# Patient Record
Sex: Male | Born: 1955 | Race: White | Hispanic: No | Smoking: Never smoker
Health system: Southern US, Community
[De-identification: ages and names within clinical notes are randomized; demographics above are authoritative.]

## PROBLEM LIST (undated history)

## (undated) DIAGNOSIS — J40 Bronchitis, not specified as acute or chronic: Secondary | ICD-10-CM

## (undated) DIAGNOSIS — I10 Essential (primary) hypertension: Secondary | ICD-10-CM

---
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
## 2003-12-19 ENCOUNTER — Ambulatory Visit (HOSPITAL_COMMUNITY): Admission: RE | Admit: 2003-12-19 | Discharge: 2003-12-19 | Payer: Self-pay | Admitting: Gastroenterology

## 2010-01-16 HISTORY — PX: COLONOSCOPY: SHX174

## 2011-03-20 ENCOUNTER — Encounter: Payer: Self-pay | Admitting: Emergency Medicine

## 2011-03-20 ENCOUNTER — Emergency Department (HOSPITAL_COMMUNITY)
Admission: EM | Admit: 2011-03-20 | Discharge: 2011-03-20 | Disposition: A | Discharge status: Home / Self Care | Payer: No Typology Code available for payment source | Attending: Emergency Medicine | Admitting: Emergency Medicine

## 2011-03-20 ENCOUNTER — Emergency Department (HOSPITAL_COMMUNITY): Payer: No Typology Code available for payment source

## 2011-03-20 DIAGNOSIS — S39012A Strain of muscle, fascia and tendon of lower back, initial encounter: Secondary | ICD-10-CM

## 2011-03-20 DIAGNOSIS — J3489 Other specified disorders of nose and nasal sinuses: Secondary | ICD-10-CM | POA: Insufficient documentation

## 2011-03-20 DIAGNOSIS — M542 Cervicalgia: Secondary | ICD-10-CM | POA: Insufficient documentation

## 2011-03-20 DIAGNOSIS — S139XXA Sprain of joints and ligaments of unspecified parts of neck, initial encounter: Secondary | ICD-10-CM | POA: Insufficient documentation

## 2011-03-20 DIAGNOSIS — S335XXA Sprain of ligaments of lumbar spine, initial encounter: Secondary | ICD-10-CM | POA: Insufficient documentation

## 2011-03-20 DIAGNOSIS — M545 Low back pain, unspecified: Secondary | ICD-10-CM | POA: Insufficient documentation

## 2011-03-20 DIAGNOSIS — I10 Essential (primary) hypertension: Secondary | ICD-10-CM | POA: Insufficient documentation

## 2011-03-20 DIAGNOSIS — S161XXA Strain of muscle, fascia and tendon at neck level, initial encounter: Secondary | ICD-10-CM

## 2011-03-20 HISTORY — DX: Essential (primary) hypertension: I10

## 2011-03-20 HISTORY — DX: Bronchitis, not specified as acute or chronic: J40

## 2011-03-20 MED ORDER — NAPROXEN 500 MG PO TABS: 500.0000 mg | ORAL_TABLET | Freq: Two times a day (BID) | ORAL | Status: AC

## 2011-03-20 MED ORDER — HYDROCODONE-ACETAMINOPHEN 5-325 MG PO TABS: 1.0000 | ORAL_TABLET | Freq: Four times a day (QID) | ORAL | Status: AC | PRN

## 2011-03-20 MED ORDER — CYCLOBENZAPRINE HCL 10 MG PO TABS: 10.0000 mg | ORAL_TABLET | Freq: Two times a day (BID) | ORAL | Status: AC | PRN

## 2011-03-20 NOTE — ED Notes (Signed)
Patient removed from LSB by EDP. 
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
## 2013-11-25 DIAGNOSIS — I10 Essential (primary) hypertension: Secondary | ICD-10-CM | POA: Insufficient documentation

## 2015-03-17 DIAGNOSIS — F411 Generalized anxiety disorder: Secondary | ICD-10-CM | POA: Insufficient documentation

## 2015-07-28 ENCOUNTER — Other Ambulatory Visit: Payer: Self-pay | Admitting: Gastroenterology

## 2015-11-30 DIAGNOSIS — E559 Vitamin D deficiency, unspecified: Secondary | ICD-10-CM | POA: Insufficient documentation

## 2015-11-30 DIAGNOSIS — E785 Hyperlipidemia, unspecified: Secondary | ICD-10-CM | POA: Insufficient documentation

## 2018-01-05 DIAGNOSIS — F321 Major depressive disorder, single episode, moderate: Secondary | ICD-10-CM | POA: Insufficient documentation

## 2018-02-19 DIAGNOSIS — F329 Major depressive disorder, single episode, unspecified: Secondary | ICD-10-CM | POA: Insufficient documentation

## 2018-11-13 ENCOUNTER — Ambulatory Visit: Payer: Self-pay | Admitting: Family Medicine

## 2018-12-22 ENCOUNTER — Other Ambulatory Visit: Payer: Self-pay

## 2018-12-23 ENCOUNTER — Ambulatory Visit (INDEPENDENT_AMBULATORY_CARE_PROVIDER_SITE_OTHER): Payer: Medicare Other | Admitting: Family Medicine

## 2018-12-23 ENCOUNTER — Encounter: Payer: Self-pay | Admitting: Family Medicine

## 2018-12-23 VITALS — BP 146/91 | HR 85 | Temp 98.7°F | Ht 70.5 in | Wt 186.2 lb

## 2018-12-23 DIAGNOSIS — M545 Low back pain, unspecified: Secondary | ICD-10-CM | POA: Insufficient documentation

## 2018-12-23 DIAGNOSIS — I10 Essential (primary) hypertension: Secondary | ICD-10-CM | POA: Diagnosis not present

## 2018-12-23 DIAGNOSIS — Z23 Encounter for immunization: Secondary | ICD-10-CM

## 2018-12-23 DIAGNOSIS — E782 Mixed hyperlipidemia: Secondary | ICD-10-CM | POA: Diagnosis not present

## 2018-12-23 DIAGNOSIS — H6122 Impacted cerumen, left ear: Secondary | ICD-10-CM | POA: Diagnosis not present

## 2018-12-23 DIAGNOSIS — G8929 Other chronic pain: Secondary | ICD-10-CM

## 2018-12-23 DIAGNOSIS — F3342 Major depressive disorder, recurrent, in full remission: Secondary | ICD-10-CM

## 2018-12-23 MED ORDER — DULOXETINE HCL 60 MG PO CPEP: 60.0000 mg | ORAL_CAPSULE | Freq: Two times a day (BID) | ORAL | 3 refills | Status: DC

## 2018-12-23 MED ORDER — TELMISARTAN 40 MG PO TABS: 40.0000 mg | ORAL_TABLET | Freq: Every day | ORAL | 3 refills | Status: DC

## 2018-12-23 NOTE — Progress Notes (Signed)
BP (!) 146/91   Pulse 85   Temp 98.7 F (37.1 C) (Temporal)   Ht 5' 10.5" (1.791 m)   Wt 186 lb 3.2 oz (84.5 kg)   SpO2 97%   BMI 26.34 kg/m    Subjective:    Patient ID: Isaiah Cook, male    DOB: 08-15-1955, 63 y.o.   MRN: [id number]  HPI: Isaiah Cook is a 63 y.o. male presenting on 12/23/2018 for New Patient (Initial Visit) ([place]) and Establish Care   HPI Patient is coming in to establish care for anxiety and depression and low back pain.  He currently takes Cymbalta to help with anxiety and depression the low back pain and feels like it is been working very well for him.  He denies any major mood swings or mood issues and says that it helps him with his back a lot.  He is retired and part of the issue was being at work all the time for his back but he has been doing a lot better since he has been retired.  Hypertension Patient is currently on losartan but he felt like he did better on the telmisartan, and their blood pressure today is 146/91. Patient denies any lightheadedness or dizziness. Patient denies headaches, blurred vision, chest pains, shortness of breath, or weakness. Denies any side effects from medication and is content with current medication.   Hyperlipidemia Patient is coming in for recheck of his hyperlipidemia. The patient is currently taking no medication and has been monitoring. They deny any issues with myalgias or history of liver damage from it. They deny any focal numbness or weakness or chest pain.   Relevant past medical, surgical, family and social history reviewed and updated as indicated. Interim medical history since our last visit reviewed. Allergies and medications reviewed and updated.  Review of Systems  Constitutional: Negative for chills and fever.  Eyes: Negative for visual disturbance.  Respiratory: Negative for shortness of breath and wheezing.   Cardiovascular: Negative for chest pain and leg swelling.  Musculoskeletal:  Negative for back pain and gait problem.  Skin: Negative for rash.  Neurological: Negative for dizziness, weakness and numbness.  All other systems reviewed and are negative.   Per HPI unless specifically indicated above  Social History   Socioeconomic History  . Marital status: Single    Spouse name: Not on file  . Number of children: Not on file  . Years of education: Not on file  . Highest education level: Not on file  Occupational History  . Not on file  Social Needs  . Financial resource strain: Not on file  . Food insecurity    Worry: Not on file    Inability: Not on file  . Transportation needs    Medical: Not on file    Non-medical: Not on file  Tobacco Use  . Smoking status: Never Smoker  . Smokeless tobacco: Never Used  Substance and Sexual Activity  . Alcohol use: No  . Drug use: No  . Sexual activity: Not Currently  Lifestyle  . Physical activity    Days per week: Not on file    Minutes per session: Not on file  . Stress: Not on file  Relationships  . Social Herbalist on phone: Not on file    Gets together: Not on file    Attends religious service: Not on file    Active member of club or organization: Not on file  Attends meetings of clubs or organizations: Not on file    Relationship status: Not on file  . Intimate partner violence    Fear of current or ex partner: Not on file    Emotionally abused: Not on file    Physically abused: Not on file    Forced sexual activity: Not on file  Other Topics Concern  . Not on file  Social History Narrative  . Not on file    Past Surgical History:  Procedure Laterality Date  . COLONOSCOPY  01/16/2010  . cranial steal plate  [id number]    Family History  Problem Relation Age of Onset  . Cancer Mother        colon  . Alzheimer's disease Mother   . Heart attack Father     Allergies as of 12/23/2018      Reactions   Penicillins Anaphylaxis, Rash      Medication List       Accurate as of  December 23, 2018  3:57 PM. If you have any questions, ask your nurse or doctor.        STOP taking these medications   telmisartan-hydrochlorothiazide 40-12.5 MG tablet Commonly known as: MICARDIS HCT Stopped by: Fransisca Kaufmann Dettinger, MD     TAKE these medications   DULoxetine 60 MG capsule Commonly known as: CYMBALTA Take 1 capsule by mouth 2 (two) times daily.   fluticasone 50 MCG/ACT nasal spray Commonly known as: FLONASE USE 2 SPRAYS IN EACH NOSTRIL DAILY   ibuprofen 200 MG tablet Commonly known as: ADVIL Take 200-400 mg by mouth every 6 (six) hours as needed. Pain   losartan 50 MG tablet Commonly known as: COZAAR Take 1 tablet by mouth daily.          Objective:    BP (!) 146/91   Pulse 85   Temp 98.7 F (37.1 C) (Temporal)   Ht 5' 10.5" (1.791 m)   Wt 186 lb 3.2 oz (84.5 kg)   SpO2 97%   BMI 26.34 kg/m   Wt Readings from Last 3 Encounters:  12/23/18 186 lb 3.2 oz (84.5 kg)  03/20/11 208 lb (94.3 kg)    Physical Exam Vitals signs and nursing note reviewed.  Constitutional:      General: He is not in acute distress.    Appearance: He is well-developed. He is not diaphoretic.  Eyes:     General: No scleral icterus.    Conjunctiva/sclera: Conjunctivae normal.  Neck:     Musculoskeletal: Neck supple.     Thyroid: No thyromegaly.  Cardiovascular:     Rate and Rhythm: Normal rate and regular rhythm.     Heart sounds: Normal heart sounds. No murmur.  Pulmonary:     Effort: Pulmonary effort is normal. No respiratory distress.     Breath sounds: Normal breath sounds. No wheezing.  Musculoskeletal: Normal range of motion.  Lymphadenopathy:     Cervical: No cervical adenopathy.  Skin:    General: Skin is warm and dry.     Findings: No rash.  Neurological:     Mental Status: He is alert and oriented to person, place, and time.     Coordination: Coordination normal.  Psychiatric:        Behavior: Behavior normal.    Nurse to lavage ears for cerumen  impaction, able to get cerumen lavaged out of left ear significantly, patient tolerated well without any issue. No results found for this or any previous visit.    Assessment &  Plan:   Problem List Items Addressed This Visit      Cardiovascular and Mediastinum   Essential hypertension   Relevant Medications   telmisartan (MICARDIS) 40 MG tablet   Other Relevant Orders   BMP8+EGFR     Other   Major depressive disorder - Primary   Relevant Medications   DULoxetine (CYMBALTA) 60 MG capsule   Low back pain   Relevant Medications   DULoxetine (CYMBALTA) 60 MG capsule    Other Visit Diagnoses    Need for immunization against influenza       Relevant Orders   Flu Vaccine QUAD High Dose(Fluad) (Completed)   Mixed hyperlipidemia       Relevant Medications   telmisartan (MICARDIS) 40 MG tablet   Other Relevant Orders   Lipid panel   Hearing loss of left ear due to cerumen impaction          Will continue the Cymbalta and will switch from losartan to telmisartan  We will see back in 6 months, monitor blood pressure at home Follow up plan: Return in about 6 months (around 06/23/2019), or if symptoms worsen or fail to improve, for htn and hld.  Caryl Pina, MD [facility] 12/23/2018, 3:57 PM

## 2018-12-24 LAB — BMP8+EGFR
BUN/Creatinine Ratio: 18 (ref 10–24)
BUN: 15 mg/dL (ref 8–27)
CO2: 22 mmol/L (ref 20–29)
Calcium: 9.5 mg/dL (ref 8.6–10.2)
Chloride: 99 mmol/L (ref 96–106)
Creatinine, Ser: 0.82 mg/dL (ref 0.76–1.27)
GFR calc Af Amer: 107 mL/min/{1.73_m2} (ref >59)
GFR calc non Af Amer: 93 mL/min/{1.73_m2} (ref >59)
Glucose: 68 mg/dL (ref 65–99)
Potassium: 3.9 mmol/L (ref 3.5–5.2)
Sodium: 137 mmol/L (ref 134–144)

## 2018-12-24 LAB — LIPID PANEL
Chol/HDL Ratio: 4.9 ratio (ref 0.0–5.0)
Cholesterol, Total: 263 mg/dL — ABNORMAL HIGH (ref 100–199)
HDL: 54 mg/dL (ref >39)
LDL Chol Calc (NIH): 175 mg/dL — ABNORMAL HIGH (ref 0–99)
Triglycerides: 185 mg/dL — ABNORMAL HIGH (ref 0–149)
VLDL Cholesterol Cal: 34 mg/dL (ref 5–40)

## 2018-12-25 ENCOUNTER — Telehealth: Payer: Self-pay | Admitting: Family Medicine

## 2018-12-25 ENCOUNTER — Other Ambulatory Visit: Payer: Self-pay

## 2018-12-25 DIAGNOSIS — E782 Mixed hyperlipidemia: Secondary | ICD-10-CM

## 2018-12-25 MED ORDER — PRAVASTATIN SODIUM 20 MG PO TABS: 20.0000 mg | ORAL_TABLET | Freq: Every day | ORAL | 3 refills | Status: DC

## 2018-12-25 NOTE — Telephone Encounter (Signed)
Called and spoke to patient in regards to cholesterol medication.  Please see result note

## 2018-12-25 NOTE — Progress Notes (Signed)
Sent in Pravastatin 20 mg 1 tab nightly # 90 with 3 refills per Dr. Merita Norton result note. Please refer to result note in labs. MPulliam, CMA/RT(R)

## 2019-06-01 ENCOUNTER — Other Ambulatory Visit: Payer: Self-pay

## 2019-06-01 DIAGNOSIS — E782 Mixed hyperlipidemia: Secondary | ICD-10-CM

## 2019-06-01 DIAGNOSIS — I10 Essential (primary) hypertension: Secondary | ICD-10-CM

## 2019-06-23 ENCOUNTER — Encounter: Payer: Self-pay | Admitting: Family Medicine

## 2019-06-23 ENCOUNTER — Telehealth (INDEPENDENT_AMBULATORY_CARE_PROVIDER_SITE_OTHER): Payer: Medicare Other | Admitting: Family Medicine

## 2019-06-23 DIAGNOSIS — E785 Hyperlipidemia, unspecified: Secondary | ICD-10-CM | POA: Diagnosis not present

## 2019-06-23 DIAGNOSIS — I10 Essential (primary) hypertension: Secondary | ICD-10-CM | POA: Diagnosis not present

## 2019-06-23 DIAGNOSIS — F411 Generalized anxiety disorder: Secondary | ICD-10-CM

## 2019-06-23 DIAGNOSIS — F3342 Major depressive disorder, recurrent, in full remission: Secondary | ICD-10-CM | POA: Diagnosis not present

## 2019-06-23 NOTE — Progress Notes (Signed)
Virtual Visit via Mychart video Note  I connected with Isaiah Cook on 06/23/19 at 0900 by video and verified that I am speaking with the correct person using two identifiers. Isaiah Cook is currently located at home and no other people are currently with her during visit. The provider, Fransisca Kaufmann Brita Jurgensen, MD is located in their office at time of visit.  Call ended at 0915  I discussed the limitations, risks, security and privacy concerns of performing an evaluation and management service by video and the availability of in person appointments. I also discussed with the patient that there may be a patient responsible charge related to this service. The patient expressed understanding and agreed to proceed.  Had both shots of Moderna  History and Present Illness: Hypertension  Patient is currently on telmisartan, and their blood pressure today is 136/79- [phone]. Patient denies any lightheadedness or dizziness. Patient denies headaches, blurred vision, chest pains, shortness of breath, or weakness. Denies any side effects from medication and is content with current medication.   Hyperlipidemia Patient is coming in for recheck of his hyperlipidemia. The patient is currently taking pravastatin. They deny any issues with myalgias or history of liver damage from it. They deny any focal numbness or weakness or chest pain.   Anxiety Patient is calling in for recheck and is doing well with the cymbalta.  No diagnosis found.  Outpatient Encounter Medications as of 06/23/2019  Medication Sig  . DULoxetine (CYMBALTA) 60 MG capsule Take 1 capsule (60 mg total) by mouth 2 (two) times daily.  . fluticasone (FLONASE) 50 MCG/ACT nasal spray USE 2 SPRAYS IN EACH NOSTRIL DAILY  . ibuprofen (ADVIL,MOTRIN) 200 MG tablet Take 200-400 mg by mouth every 6 (six) hours as needed. Pain   . pravastatin (PRAVACHOL) 20 MG tablet Take 1 tablet (20 mg total) by mouth daily.  Marland Kitchen telmisartan (MICARDIS) 40  MG tablet Take 1 tablet (40 mg total) by mouth daily.   No facility-administered encounter medications on file as of 06/23/2019.    Review of Systems  Constitutional: Negative for chills and fever.  Eyes: Negative for visual disturbance.  Respiratory: Negative for shortness of breath and wheezing.   Cardiovascular: Negative for chest pain and leg swelling.  Musculoskeletal: Negative for back pain and gait problem.  Skin: Negative for rash.  Neurological: Negative for dizziness, weakness and light-headedness.  All other systems reviewed and are negative.   Observations/Objective: Patient sounds and looks comfortable and in no acute distress  Assessment and Plan: Problem List Items Addressed This Visit      Cardiovascular and Mediastinum   Essential hypertension   Relevant Orders   CMP14+EGFR     Other   Anxiety, generalized   Relevant Orders   CBC with Differential/Platelet   Dyslipidemia - Primary   Relevant Orders   Lipid panel   Major depressive disorder   Relevant Orders   CBC with Differential/Platelet      Continue current medications and no changes Follow up plan: Return in about 6 months (around 12/23/2019), or if symptoms worsen or fail to improve, for htn and dylipidemia and anxiety.     I discussed the assessment and treatment plan with the patient. The patient was provided an opportunity to ask questions and all were answered. The patient agreed with the plan and demonstrated an understanding of the instructions.   The patient was advised to call back or seek an in-person evaluation if the symptoms worsen or if the condition  fails to improve as anticipated.  The above assessment and management plan was discussed with the patient. The patient verbalized understanding of and has agreed to the management plan. Patient is aware to call the clinic if symptoms persist or worsen. Patient is aware when to return to the clinic for a follow-up visit. Patient educated on  when it is appropriate to go to the emergency department.    I provided 15 minutes of non-face-to-face time during this encounter.    Worthy Rancher, MD

## 2019-07-13 ENCOUNTER — Other Ambulatory Visit: Payer: Self-pay

## 2019-07-13 ENCOUNTER — Other Ambulatory Visit: Payer: Medicare Other

## 2019-07-13 DIAGNOSIS — I10 Essential (primary) hypertension: Secondary | ICD-10-CM | POA: Diagnosis not present

## 2019-07-13 DIAGNOSIS — E785 Hyperlipidemia, unspecified: Secondary | ICD-10-CM

## 2019-07-13 DIAGNOSIS — F3342 Major depressive disorder, recurrent, in full remission: Secondary | ICD-10-CM

## 2019-07-13 DIAGNOSIS — F411 Generalized anxiety disorder: Secondary | ICD-10-CM

## 2019-07-13 LAB — CBC WITH DIFFERENTIAL/PLATELET
Basophils Absolute: 0.1 10*3/uL (ref 0.0–0.2)
Basos: 1 %
EOS (ABSOLUTE): 0.2 10*3/uL (ref 0.0–0.4)
Eos: 4 %
Hematocrit: 45.2 % (ref 37.5–51.0)
Hemoglobin: 15.4 g/dL (ref 13.0–17.7)
Immature Grans (Abs): 0 10*3/uL (ref 0.0–0.1)
Immature Granulocytes: 0 %
Lymphocytes Absolute: 2.1 10*3/uL (ref 0.7–3.1)
Lymphs: 38 %
MCH: 30.1 pg (ref 26.6–33.0)
MCHC: 34.1 g/dL (ref 31.5–35.7)
MCV: 89 fL (ref 79–97)
Monocytes Absolute: 0.5 10*3/uL (ref 0.1–0.9)
Monocytes: 9 %
Neutrophils Absolute: 2.6 10*3/uL (ref 1.4–7.0)
Neutrophils: 48 %
Platelets: 157 10*3/uL (ref 150–450)
RBC: 5.11 x10E6/uL (ref 4.14–5.80)
RDW: 12.9 % (ref 11.6–15.4)
WBC: 5.4 10*3/uL (ref 3.4–10.8)

## 2019-07-13 LAB — LIPID PANEL
Chol/HDL Ratio: 3.9 ratio (ref 0.0–5.0)
Cholesterol, Total: 189 mg/dL (ref 100–199)
HDL: 48 mg/dL (ref >39)
LDL Chol Calc (NIH): 123 mg/dL — ABNORMAL HIGH (ref 0–99)
Triglycerides: 101 mg/dL (ref 0–149)
VLDL Cholesterol Cal: 18 mg/dL (ref 5–40)

## 2019-07-13 LAB — CMP14+EGFR
ALT: 45 IU/L — ABNORMAL HIGH (ref 0–44)
AST: 34 IU/L (ref 0–40)
Albumin/Globulin Ratio: 2 (ref 1.2–2.2)
Albumin: 4.3 g/dL (ref 3.8–4.8)
Alkaline Phosphatase: 74 IU/L (ref 39–117)
BUN/Creatinine Ratio: 23 (ref 10–24)
BUN: 20 mg/dL (ref 8–27)
Bilirubin Total: 0.4 mg/dL (ref 0.0–1.2)
CO2: 25 mmol/L (ref 20–29)
Calcium: 9.3 mg/dL (ref 8.6–10.2)
Chloride: 104 mmol/L (ref 96–106)
Creatinine, Ser: 0.88 mg/dL (ref 0.76–1.27)
GFR calc Af Amer: 103 mL/min/{1.73_m2} (ref >59)
GFR calc non Af Amer: 90 mL/min/{1.73_m2} (ref >59)
Globulin, Total: 2.2 g/dL (ref 1.5–4.5)
Glucose: 104 mg/dL — ABNORMAL HIGH (ref 65–99)
Potassium: 4.1 mmol/L (ref 3.5–5.2)
Sodium: 141 mmol/L (ref 134–144)
Total Protein: 6.5 g/dL (ref 6.0–8.5)

## 2019-09-22 ENCOUNTER — Other Ambulatory Visit: Payer: Self-pay | Admitting: Family Medicine

## 2019-09-22 DIAGNOSIS — E782 Mixed hyperlipidemia: Secondary | ICD-10-CM

## 2019-10-27 ENCOUNTER — Other Ambulatory Visit: Payer: Self-pay | Admitting: Family Medicine

## 2019-11-19 ENCOUNTER — Other Ambulatory Visit: Payer: Self-pay | Admitting: Family Medicine

## 2019-11-19 DIAGNOSIS — I10 Essential (primary) hypertension: Secondary | ICD-10-CM

## 2019-11-30 ENCOUNTER — Other Ambulatory Visit: Payer: Self-pay | Admitting: Family Medicine

## 2019-11-30 DIAGNOSIS — F3342 Major depressive disorder, recurrent, in full remission: Secondary | ICD-10-CM

## 2019-11-30 DIAGNOSIS — G8929 Other chronic pain: Secondary | ICD-10-CM

## 2019-12-27 ENCOUNTER — Other Ambulatory Visit: Payer: Self-pay | Admitting: Family Medicine

## 2019-12-27 DIAGNOSIS — E782 Mixed hyperlipidemia: Secondary | ICD-10-CM

## 2020-01-12 ENCOUNTER — Encounter: Payer: Self-pay | Admitting: Family Medicine

## 2020-01-12 ENCOUNTER — Ambulatory Visit (INDEPENDENT_AMBULATORY_CARE_PROVIDER_SITE_OTHER): Payer: Medicare Other | Admitting: Family Medicine

## 2020-01-12 ENCOUNTER — Other Ambulatory Visit: Payer: Self-pay

## 2020-01-12 VITALS — BP 129/76 | HR 75 | Temp 98.0°F | Ht 70.5 in | Wt 198.0 lb

## 2020-01-12 DIAGNOSIS — E785 Hyperlipidemia, unspecified: Secondary | ICD-10-CM | POA: Diagnosis not present

## 2020-01-12 DIAGNOSIS — E782 Mixed hyperlipidemia: Secondary | ICD-10-CM

## 2020-01-12 DIAGNOSIS — Z23 Encounter for immunization: Secondary | ICD-10-CM | POA: Diagnosis not present

## 2020-01-12 DIAGNOSIS — I10 Essential (primary) hypertension: Secondary | ICD-10-CM | POA: Diagnosis not present

## 2020-01-12 DIAGNOSIS — F411 Generalized anxiety disorder: Secondary | ICD-10-CM

## 2020-01-12 DIAGNOSIS — F3342 Major depressive disorder, recurrent, in full remission: Secondary | ICD-10-CM

## 2020-01-12 LAB — CBC WITH DIFFERENTIAL/PLATELET
EOS (ABSOLUTE): 0.1 10*3/uL (ref 0.0–0.4)
Hematocrit: 45.4 % (ref 37.5–51.0)
Immature Granulocytes: 0 %
Lymphs: 37 %
RBC: 5.33 x10E6/uL (ref 4.14–5.80)
RDW: 12.8 % (ref 11.6–15.4)

## 2020-01-12 LAB — LIPID PANEL

## 2020-01-12 LAB — CMP14+EGFR
Bilirubin Total: 0.6 mg/dL (ref 0.0–1.2)
GFR calc Af Amer: 101 mL/min/{1.73_m2} (ref >59)
Globulin, Total: 2.2 g/dL (ref 1.5–4.5)

## 2020-01-12 MED ORDER — DULOXETINE HCL 60 MG PO CPEP: 60.0000 mg | ORAL_CAPSULE | Freq: Two times a day (BID) | ORAL | 3 refills | Status: DC

## 2020-01-12 MED ORDER — PRAVASTATIN SODIUM 20 MG PO TABS: 20.0000 mg | ORAL_TABLET | Freq: Every day | ORAL | 3 refills | Status: DC

## 2020-01-12 NOTE — Progress Notes (Signed)
BP 129/76    Pulse 75    Temp 98 F (36.7 C)    Ht 5' 10.5" (1.791 m)    Wt 198 lb (89.8 kg)    SpO2 98%    BMI 28.01 kg/m    Subjective:   Patient ID: Isaiah Cook, male    DOB: 1956-01-17, 64 y.o.   MRN: [id number]  HPI: Isaiah Cook is a 64 y.o. male presenting on 01/12/2020 for Medical Management of Chronic Issues, Hyperlipidemia, and Depression   HPI Hypertension Patient is currently on telmisartan, and their blood pressure today is 129/76. Patient denies any lightheadedness or dizziness. Patient denies headaches, blurred vision, chest pains, shortness of breath, or weakness. Denies any side effects from medication and is content with current medication.   Hyperlipidemia Patient is coming in for recheck of his hyperlipidemia. The patient is currently taking pravastatin. They deny any issues with myalgias or history of liver damage from it. They deny any focal numbness or weakness or chest pain.   Depression anxiety recheck Patient is taking Cymbalta for depression anxiety to help with his back.  He seems to be doing very well on it and says that it is working.  He denies any major issues with it.  Relevant past medical, surgical, family and social history reviewed and updated as indicated. Interim medical history since our last visit reviewed. Allergies and medications reviewed and updated.  Review of Systems  Constitutional: Negative for chills and fever.  Respiratory: Negative for shortness of breath and wheezing.   Cardiovascular: Negative for chest pain and leg swelling.  Musculoskeletal: Negative for back pain and gait problem.  Skin: Negative for rash.  Neurological: Negative for dizziness, weakness and light-headedness.  All other systems reviewed and are negative.   Per HPI unless specifically indicated above   Allergies as of 01/12/2020      Reactions   Penicillins Anaphylaxis, Rash      Medication List       Accurate as of January 12, 2020 11:11  AM. If you have any questions, ask your nurse or doctor.        DULoxetine 60 MG capsule Commonly known as: CYMBALTA Take 1 capsule (60 mg total) by mouth 2 (two) times daily.   fluticasone 50 MCG/ACT nasal spray Commonly known as: FLONASE USE 2 SPRAYS IN EACH NOSTRIL DAILY   ibuprofen 200 MG tablet Commonly known as: ADVIL Take 200-400 mg by mouth every 6 (six) hours as needed. Pain   pravastatin 20 MG tablet Commonly known as: PRAVACHOL Take 1 tablet (20 mg total) by mouth daily. What changed: See the new instructions. Changed by: Fransisca Kaufmann Solina Heron, MD   telmisartan 40 MG tablet Commonly known as: MICARDIS TAKE ONE (1) TABLET EACH DAY   vitamin C with rose hips 500 MG tablet Take 500 mg by mouth daily.   Zinc 50 MG Caps Take by mouth.        Objective:   BP 129/76    Pulse 75    Temp 98 F (36.7 C)    Ht 5' 10.5" (1.791 m)    Wt 198 lb (89.8 kg)    SpO2 98%    BMI 28.01 kg/m   Wt Readings from Last 3 Encounters:  01/12/20 198 lb (89.8 kg)  12/23/18 186 lb 3.2 oz (84.5 kg)  03/20/11 208 lb (94.3 kg)    Physical Exam Vitals and nursing note reviewed.  Constitutional:      General: He is  not in acute distress.    Appearance: He is well-developed. He is not diaphoretic.  Eyes:     General: No scleral icterus.    Conjunctiva/sclera: Conjunctivae normal.  Neck:     Thyroid: No thyromegaly.  Cardiovascular:     Rate and Rhythm: Normal rate and regular rhythm.     Heart sounds: Normal heart sounds. No murmur heard.   Pulmonary:     Effort: Pulmonary effort is normal. No respiratory distress.     Breath sounds: Normal breath sounds. No wheezing.  Musculoskeletal:        General: Normal range of motion.     Cervical back: Neck supple.  Lymphadenopathy:     Cervical: No cervical adenopathy.  Skin:    General: Skin is warm and dry.     Findings: No rash.  Neurological:     Mental Status: He is alert and oriented to person, place, and time.      Coordination: Coordination normal.  Psychiatric:        Behavior: Behavior normal.       Assessment & Plan:   Problem List Items Addressed This Visit      Cardiovascular and Mediastinum   Essential hypertension - Primary   Relevant Medications   pravastatin (PRAVACHOL) 20 MG tablet   Other Relevant Orders   CBC with Differential/Platelet   CMP14+EGFR   Lipid panel     Other   Anxiety, generalized   Relevant Medications   DULoxetine (CYMBALTA) 60 MG capsule   Other Relevant Orders   CBC with Differential/Platelet   Dyslipidemia   Relevant Medications   pravastatin (PRAVACHOL) 20 MG tablet   Other Relevant Orders   Lipid panel   Major depressive disorder   Relevant Medications   DULoxetine (CYMBALTA) 60 MG capsule    Other Visit Diagnoses    Need for vaccination against Streptococcus pneumoniae       Relevant Orders   Pneumococcal polysaccharide vaccine 23-valent greater than or equal to 2yo subcutaneous/IM   Need for immunization against influenza       Relevant Orders   Flu Vaccine QUAD High Dose(Fluad) (Completed)   Mixed hyperlipidemia       Relevant Medications   pravastatin (PRAVACHOL) 20 MG tablet      Seems to be doing well, continue current medication, no changes. Follow up plan: Return in about 6 months (around 07/12/2020), or if symptoms worsen or fail to improve, for Physical exam and recheck cholesterol.  Counseling provided for all of the vaccine components Orders Placed This Encounter  Procedures   Pneumococcal polysaccharide vaccine 23-valent greater than or equal to 2yo subcutaneous/IM   Flu Vaccine QUAD High Dose(Fluad)   CBC with Differential/Platelet   CMP14+EGFR   Lipid panel    Caryl Pina, MD [facility] 01/12/2020, 11:11 AM

## 2020-01-13 LAB — LIPID PANEL
Chol/HDL Ratio: 4.8 ratio (ref 0.0–5.0)
Cholesterol, Total: 203 mg/dL — ABNORMAL HIGH (ref 100–199)
HDL: 42 mg/dL (ref >39)
LDL Chol Calc (NIH): 137 mg/dL — ABNORMAL HIGH (ref 0–99)
Triglycerides: 131 mg/dL (ref 0–149)

## 2020-01-13 LAB — CMP14+EGFR
ALT: 40 IU/L (ref 0–44)
AST: 33 IU/L (ref 0–40)
Albumin/Globulin Ratio: 2.1 (ref 1.2–2.2)
Albumin: 4.6 g/dL (ref 3.8–4.8)
Alkaline Phosphatase: 69 IU/L (ref 44–121)
BUN/Creatinine Ratio: 15 (ref 10–24)
BUN: 14 mg/dL (ref 8–27)
CO2: 24 mmol/L (ref 20–29)
Calcium: 9.6 mg/dL (ref 8.6–10.2)
Chloride: 102 mmol/L (ref 96–106)
Creatinine, Ser: 0.91 mg/dL (ref 0.76–1.27)
GFR calc non Af Amer: 88 mL/min/{1.73_m2} (ref >59)
Glucose: 102 mg/dL — ABNORMAL HIGH (ref 65–99)
Potassium: 4.1 mmol/L (ref 3.5–5.2)
Sodium: 140 mmol/L (ref 134–144)
Total Protein: 6.8 g/dL (ref 6.0–8.5)

## 2020-01-13 LAB — CBC WITH DIFFERENTIAL/PLATELET
Basophils Absolute: 0.1 10*3/uL (ref 0.0–0.2)
Basos: 1 %
Eos: 3 %
Hemoglobin: 15.9 g/dL (ref 13.0–17.7)
Immature Grans (Abs): 0 10*3/uL (ref 0.0–0.1)
Lymphocytes Absolute: 2 10*3/uL (ref 0.7–3.1)
MCH: 29.8 pg (ref 26.6–33.0)
MCHC: 35 g/dL (ref 31.5–35.7)
MCV: 85 fL (ref 79–97)
Monocytes Absolute: 0.6 10*3/uL (ref 0.1–0.9)
Monocytes: 10 %
Neutrophils Absolute: 2.6 10*3/uL (ref 1.4–7.0)
Neutrophils: 49 %
Platelets: 171 10*3/uL (ref 150–450)
WBC: 5.3 10*3/uL (ref 3.4–10.8)

## 2020-01-18 ENCOUNTER — Telehealth: Payer: Self-pay

## 2020-01-18 NOTE — Telephone Encounter (Signed)
Left message to return call 

## 2020-01-25 ENCOUNTER — Telehealth: Payer: Self-pay

## 2020-01-25 MED ORDER — PRAVASTATIN SODIUM 40 MG PO TABS: 40.0000 mg | ORAL_TABLET | Freq: Every day | ORAL | 1 refills | Status: DC

## 2020-01-25 NOTE — Telephone Encounter (Signed)
Patient aware of lab results.

## 2020-02-24 ENCOUNTER — Telehealth: Payer: Self-pay

## 2020-02-24 NOTE — Telephone Encounter (Signed)
Patient aware to wait 30 days.

## 2020-04-05 ENCOUNTER — Ambulatory Visit (INDEPENDENT_AMBULATORY_CARE_PROVIDER_SITE_OTHER): Payer: Medicare Other | Admitting: Family Medicine

## 2020-04-05 ENCOUNTER — Other Ambulatory Visit: Payer: Self-pay

## 2020-04-05 ENCOUNTER — Encounter: Payer: Self-pay | Admitting: Family Medicine

## 2020-04-05 VITALS — BP 133/74 | HR 94 | Ht 70.0 in | Wt 202.0 lb

## 2020-04-05 DIAGNOSIS — H9202 Otalgia, left ear: Secondary | ICD-10-CM

## 2020-04-05 DIAGNOSIS — H6122 Impacted cerumen, left ear: Secondary | ICD-10-CM | POA: Diagnosis not present

## 2020-04-05 MED ORDER — NEOMYCIN-POLYMYXIN-HC 3.5-10000-1 OT SOLN: 3.0000 [drp] | Freq: Four times a day (QID) | OTIC | 0 refills | Status: DC

## 2020-04-05 NOTE — Progress Notes (Signed)
BP 133/74   Pulse 94   Ht 5\' 10"  (1.778 m)   Wt 202 lb (91.6 kg)   SpO2 99%   BMI 28.98 kg/m    Subjective:   Patient ID: Isaiah Cook, male    DOB: 09/08/1955, 65 y.o.   MRN: [id number]  HPI: Isaiah Cook is a 65 y.o. male presenting on 04/05/2020 for Ear Fullness (Itching and left sided head pain)   HPI Patient is coming in today with complaints of left ear pain that is hurting back towards the back of his head on that left side.  He says he is got like this before and has used some eardrops for it.  He says that it has started hurting this time over the past month and a half.  Is not severe but more annoying.  He denies any fevers or chills.  He also has itching in the left ear canal as well as the right sometimes but the left one is the one that is hurting him.  He denies any fevers or chills.  He denies any cough or congestion or sinus issues.  Relevant past medical, surgical, family and social history reviewed and updated as indicated. Interim medical history since our last visit reviewed. Allergies and medications reviewed and updated.  Review of Systems  Constitutional: Negative for chills and fever.  HENT: Positive for ear pain. Negative for congestion and ear discharge.   Eyes: Negative for visual disturbance.  Respiratory: Negative for shortness of breath and wheezing.   Cardiovascular: Negative for chest pain and leg swelling.  Musculoskeletal: Negative for back pain and gait problem.  Skin: Negative for rash.  All other systems reviewed and are negative.   Per HPI unless specifically indicated above   Allergies as of 04/05/2020      Reactions   Penicillins Anaphylaxis, Rash      Medication List       Accurate as of April 05, 2020  3:37 PM. If you have any questions, ask your nurse or doctor.        DULoxetine 60 MG capsule Commonly known as: CYMBALTA Take 1 capsule (60 mg total) by mouth 2 (two) times daily.   fluticasone 50 MCG/ACT nasal  spray Commonly known as: FLONASE USE 2 SPRAYS IN EACH NOSTRIL DAILY   ibuprofen 200 MG tablet Commonly known as: ADVIL Take 200-400 mg by mouth every 6 (six) hours as needed. Pain   pravastatin 40 MG tablet Commonly known as: PRAVACHOL Take 1 tablet (40 mg total) by mouth daily.   telmisartan 40 MG tablet Commonly known as: MICARDIS TAKE ONE (1) TABLET EACH DAY   vitamin C with rose hips 500 MG tablet Take 500 mg by mouth daily.   Zinc 50 MG Caps Take by mouth.        Objective:   BP 133/74   Pulse 94   Ht 5\' 10"  (1.778 m)   Wt 202 lb (91.6 kg)   SpO2 99%   BMI 28.98 kg/m   Wt Readings from Last 3 Encounters:  04/05/20 202 lb (91.6 kg)  01/12/20 198 lb (89.8 kg)  12/23/18 186 lb 3.2 oz (84.5 kg)    Physical Exam Vitals and nursing note reviewed.  HENT:     Left Ear: There is impacted cerumen.  Neurological:     Mental Status: He is alert.       Assessment & Plan:   Problem List Items Addressed This Visit   None  Visit Diagnoses    Left ear pain    -  Primary   Relevant Medications   neomycin-polymyxin-hydrocortisone (CORTISPORIN) OTIC solution   Impacted cerumen of left ear       Relevant Medications   neomycin-polymyxin-hydrocortisone (CORTISPORIN) OTIC solution     Nurse to lavage, patient tolerated well.   Follow up plan: Return if symptoms worsen or fail to improve.  Counseling provided for all of the vaccine components No orders of the defined types were placed in this encounter.   Arville Care, MD [facility] 04/05/2020, 3:37 PM

## 2020-05-17 ENCOUNTER — Other Ambulatory Visit: Payer: Self-pay | Admitting: Family Medicine

## 2020-07-12 ENCOUNTER — Other Ambulatory Visit: Payer: Self-pay

## 2020-07-12 ENCOUNTER — Encounter: Payer: Self-pay | Admitting: Family Medicine

## 2020-07-12 ENCOUNTER — Ambulatory Visit (INDEPENDENT_AMBULATORY_CARE_PROVIDER_SITE_OTHER): Payer: Medicare Other | Admitting: Family Medicine

## 2020-07-12 VITALS — BP 141/83 | HR 79 | Ht 70.0 in | Wt 209.0 lb

## 2020-07-12 DIAGNOSIS — I10 Essential (primary) hypertension: Secondary | ICD-10-CM

## 2020-07-12 DIAGNOSIS — Z0001 Encounter for general adult medical examination with abnormal findings: Secondary | ICD-10-CM

## 2020-07-12 DIAGNOSIS — E785 Hyperlipidemia, unspecified: Secondary | ICD-10-CM | POA: Diagnosis not present

## 2020-07-12 DIAGNOSIS — F3342 Major depressive disorder, recurrent, in full remission: Secondary | ICD-10-CM | POA: Diagnosis not present

## 2020-07-12 DIAGNOSIS — Z125 Encounter for screening for malignant neoplasm of prostate: Secondary | ICD-10-CM

## 2020-07-12 MED ORDER — PRAVASTATIN SODIUM 40 MG PO TABS: ORAL_TABLET | ORAL | 3 refills | Status: DC

## 2020-07-12 MED ORDER — FLUTICASONE PROPIONATE 50 MCG/ACT NA SUSP: 2.0000 | Freq: Every day | NASAL | 3 refills | Status: DC

## 2020-07-12 MED ORDER — TELMISARTAN 40 MG PO TABS: ORAL_TABLET | ORAL | 3 refills | Status: DC

## 2020-07-12 MED ORDER — DULOXETINE HCL 60 MG PO CPEP: 60.0000 mg | ORAL_CAPSULE | Freq: Two times a day (BID) | ORAL | 3 refills | Status: DC

## 2020-07-12 NOTE — Progress Notes (Signed)
BP (!) 141/83   Pulse 79   Ht 5\' 10"  (1.778 m)   Wt 209 lb (94.8 kg)   SpO2 97%   BMI 29.99 kg/m    Subjective:   Patient ID: Isaiah Cook, male    DOB: [birth year], 65 y.o.   MRN: [id number]  HPI: Isaiah Cook is a 65 y.o. male presenting on 07/12/2020 for Medical Management of Chronic Issues, Hyperlipidemia, Hypertension, and Depression   HPI  Physical exam Patient is coming in today for physical exam cholesterol.  He is going to have his colonoscopy later this year so we will defer checking a prostate exam and then.  Hyperlipidemia Patient is coming in for recheck of his hyperlipidemia. The patient is currently taking pravastatin. They deny any issues with myalgias or history of liver damage from it. They deny any focal numbness or weakness or chest pain.   Hypertension Patient is currently on Micardis, and their blood pressure today is 141/83. Patient denies any lightheadedness or dizziness. Patient denies headaches, blurred vision, chest pains, shortness of breath, or weakness. Denies any side effects from medication and is content with current medication.   Anxiety depression and back pain Patient takes Xarelto for anxiety depression back pain and says is working well for him.  He denies major issues.  He feels like everything is going well and healthwise doing pretty good.  He denies any suicidal ideations or thoughts of hurting himself.  Relevant past medical, surgical, family and social history reviewed and updated as indicated. Interim medical history since our last visit reviewed. Allergies and medications reviewed and updated.  Review of Systems  Constitutional: Negative for chills and fever.  Eyes: Negative for visual disturbance.  Respiratory: Negative for shortness of breath and wheezing.   Cardiovascular: Negative for chest pain and leg swelling.  Musculoskeletal: Negative for back pain and gait problem.  Skin: Negative for rash.  Neurological: Negative  for dizziness, weakness and light-headedness.  All other systems reviewed and are negative.   Per HPI unless specifically indicated above   Allergies as of 07/12/2020      Reactions   Penicillins Anaphylaxis, Rash      Medication List       Accurate as of July 12, 2020 10:40 AM. If you have any questions, ask your nurse or doctor.        DULoxetine 60 MG capsule Commonly known as: CYMBALTA Take 1 capsule (60 mg total) by mouth 2 (two) times daily.   fluticasone 50 MCG/ACT nasal spray Commonly known as: FLONASE USE 2 SPRAYS IN EACH NOSTRIL DAILY   ibuprofen 200 MG tablet Commonly known as: ADVIL Take 200-400 mg by mouth every 6 (six) hours as needed. Pain   neomycin-polymyxin-hydrocortisone OTIC solution Commonly known as: CORTISPORIN Place 3 drops into the left ear 4 (four) times daily.   pravastatin 40 MG tablet Commonly known as: PRAVACHOL TAKE ONE (1) TABLET EACH DAY   telmisartan 40 MG tablet Commonly known as: MICARDIS TAKE ONE (1) TABLET EACH DAY   vitamin C with rose hips 500 MG tablet Take 500 mg by mouth daily.   Zinc 50 MG Caps Take by mouth.        Objective:   BP (!) 141/83   Pulse 79   Ht 5\' 10"  (1.778 m)   Wt 209 lb (94.8 kg)   SpO2 97%   BMI 29.99 kg/m   Wt Readings from Last 3 Encounters:  07/12/20 209 lb (94.8 kg)  04/05/20  202 lb (91.6 kg)  01/12/20 198 lb (89.8 kg)    Physical Exam Vitals and nursing note reviewed.  Constitutional:      General: He is not in acute distress.    Appearance: He is well-developed. He is not diaphoretic.  Eyes:     General: No scleral icterus.    Conjunctiva/sclera: Conjunctivae normal.  Neck:     Thyroid: No thyromegaly.  Cardiovascular:     Rate and Rhythm: Normal rate and regular rhythm.     Heart sounds: Normal heart sounds. No murmur heard.   Pulmonary:     Effort: Pulmonary effort is normal. No respiratory distress.     Breath sounds: Normal breath sounds. No wheezing.   Musculoskeletal:        General: Normal range of motion.     Cervical back: Neck supple.  Lymphadenopathy:     Cervical: No cervical adenopathy.  Skin:    General: Skin is warm and dry.     Findings: No rash.  Neurological:     Mental Status: He is alert and oriented to person, place, and time.     Coordination: Coordination normal.  Psychiatric:        Behavior: Behavior normal.       Assessment & Plan:   Problem List Items Addressed This Visit      Cardiovascular and Mediastinum   Essential hypertension   Relevant Medications   pravastatin (PRAVACHOL) 40 MG tablet   telmisartan (MICARDIS) 40 MG tablet   Other Relevant Orders   CBC with Differential/Platelet   CMP14+EGFR     Other   Dyslipidemia - Primary   Relevant Medications   pravastatin (PRAVACHOL) 40 MG tablet   Other Relevant Orders   Lipid panel   Major depressive disorder   Relevant Medications   DULoxetine (CYMBALTA) 60 MG capsule    Other Visit Diagnoses    Prostate cancer screening       Relevant Orders   PSA, total and free     Continue current medication, seems to be doing well.  We will check prostate antigen.  Blood work and he is going to call for colonoscopy.  Follow up plan: Return in about 6 months (around 01/11/2021), or if symptoms worsen or fail to improve, for Hypertension and hyperlipidemia and physical.  Counseling provided for all of the vaccine components No orders of the defined types were placed in this encounter.   Caryl Pina, MD [facility] 07/12/2020, 10:40 AM

## 2020-07-13 LAB — CMP14+EGFR
ALT: 26 IU/L (ref 0–44)
AST: 24 IU/L (ref 0–40)
Albumin/Globulin Ratio: 2.2 (ref 1.2–2.2)
Albumin: 4.7 g/dL (ref 3.8–4.8)
Alkaline Phosphatase: 72 IU/L (ref 44–121)
BUN/Creatinine Ratio: 21 (ref 10–24)
BUN: 18 mg/dL (ref 8–27)
Bilirubin Total: 0.4 mg/dL (ref 0.0–1.2)
CO2: 25 mmol/L (ref 20–29)
Calcium: 9.7 mg/dL (ref 8.6–10.2)
Chloride: 97 mmol/L (ref 96–106)
Creatinine, Ser: 0.87 mg/dL (ref 0.76–1.27)
Globulin, Total: 2.1 g/dL (ref 1.5–4.5)
Glucose: 97 mg/dL (ref 65–99)
Potassium: 4.6 mmol/L (ref 3.5–5.2)
Sodium: 137 mmol/L (ref 134–144)
Total Protein: 6.8 g/dL (ref 6.0–8.5)
eGFR: 95 mL/min/{1.73_m2} (ref >59)

## 2020-07-13 LAB — LIPID PANEL
Chol/HDL Ratio: 4.2 ratio (ref 0.0–5.0)
Cholesterol, Total: 166 mg/dL (ref 100–199)
HDL: 40 mg/dL (ref >39)
LDL Chol Calc (NIH): 101 mg/dL — ABNORMAL HIGH (ref 0–99)
Triglycerides: 143 mg/dL (ref 0–149)
VLDL Cholesterol Cal: 25 mg/dL (ref 5–40)

## 2020-07-13 LAB — CBC WITH DIFFERENTIAL/PLATELET
Basophils Absolute: 0.1 10*3/uL (ref 0.0–0.2)
Basos: 1 %
EOS (ABSOLUTE): 0.2 10*3/uL (ref 0.0–0.4)
Eos: 4 %
Hematocrit: 46.8 % (ref 37.5–51.0)
Hemoglobin: 15.8 g/dL (ref 13.0–17.7)
Immature Grans (Abs): 0 10*3/uL (ref 0.0–0.1)
Immature Granulocytes: 0 %
Lymphocytes Absolute: 2.1 10*3/uL (ref 0.7–3.1)
Lymphs: 35 %
MCH: 30 pg (ref 26.6–33.0)
MCHC: 33.8 g/dL (ref 31.5–35.7)
MCV: 89 fL (ref 79–97)
Monocytes Absolute: 0.6 10*3/uL (ref 0.1–0.9)
Monocytes: 11 %
Neutrophils Absolute: 2.9 10*3/uL (ref 1.4–7.0)
Neutrophils: 49 %
Platelets: 189 10*3/uL (ref 150–450)
RBC: 5.26 x10E6/uL (ref 4.14–5.80)
RDW: 13.1 % (ref 11.6–15.4)
WBC: 5.8 10*3/uL (ref 3.4–10.8)

## 2020-07-13 LAB — PSA, TOTAL AND FREE
PSA, Free Pct: 18.6 %
PSA, Free: 0.13 ng/mL
Prostate Specific Ag, Serum: 0.7 ng/mL (ref 0.0–4.0)

## 2020-07-14 ENCOUNTER — Telehealth: Payer: Self-pay | Admitting: Family Medicine

## 2020-07-14 NOTE — Progress Notes (Signed)
Pt calling back about labs  

## 2020-09-21 ENCOUNTER — Ambulatory Visit (INDEPENDENT_AMBULATORY_CARE_PROVIDER_SITE_OTHER): Payer: Medicare Other

## 2020-09-21 VITALS — Ht 70.0 in | Wt 200.0 lb

## 2020-09-21 DIAGNOSIS — Z0000 Encounter for general adult medical examination without abnormal findings: Secondary | ICD-10-CM | POA: Diagnosis not present

## 2020-09-21 NOTE — Progress Notes (Signed)
Subjective:   Isaiah Cook is a 65 y.o. male who presents for an Initial Medicare Annual Wellness Visit.  Virtual Visit via Telephone Note  I connected with  RENJI BERWICK on 09/21/20 at  9:45 AM EDT by telephone and verified that I am speaking with the correct person using two identifiers.  Location: Patient: Home Provider: [facility] Persons participating in the virtual visit: patient/Nurse Health Advisor   I discussed the limitations, risks, security and privacy concerns of performing an evaluation and management service by telephone and the availability of in person appointments. The patient expressed understanding and agreed to proceed.  Interactive audio and video telecommunications were attempted between this nurse and patient, however failed, due to patient having technical difficulties OR patient did not have access to video capability.  We continued and completed visit with audio only.  Some vital signs may be absent or patient reported.   Anaalicia Reimann E Saraphina Lauderbaugh, LPN   Review of Systems     Cardiac Risk Factors include: advanced age (>35men, >68 women);dyslipidemia;hypertension;male gender     Objective:    Today's Vitals   09/21/20 0906  Weight: 200 lb (90.7 kg)  Height: 5\' 10"  (1.778 m)  PainSc: 2    Body mass index is 28.7 kg/m.  Advanced Directives 09/21/2020  Does Patient Have a Medical Advance Directive? No  Would patient like information on creating a medical advance directive? No - Patient declined    Current Medications (verified) Outpatient Encounter Medications as of 09/21/2020  Medication Sig   Ascorbic Acid (VITAMIN C WITH ROSE HIPS) 500 MG tablet Take 500 mg by mouth daily.   DULoxetine (CYMBALTA) 60 MG capsule Take 1 capsule (60 mg total) by mouth 2 (two) times daily.   fluticasone (FLONASE) 50 MCG/ACT nasal spray Place 2 sprays into both nostrils daily.   ibuprofen (ADVIL,MOTRIN) 200 MG tablet Take 200-400 mg by mouth every 6 (six) hours as needed.  Pain   neomycin-polymyxin-hydrocortisone (CORTISPORIN) OTIC solution Place 3 drops into the left ear 4 (four) times daily.   pravastatin (PRAVACHOL) 40 MG tablet TAKE ONE (1) TABLET EACH DAY   telmisartan (MICARDIS) 40 MG tablet TAKE ONE (1) TABLET EACH DAY   Zinc 50 MG CAPS Take by mouth.   No facility-administered encounter medications on file as of 09/21/2020.    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   Family History  Problem Relation Age of Onset   Cancer Mother        colon   Alzheimer's disease Mother    Heart attack Father    Social History   Socioeconomic History   Marital status: Divorced    Spouse name: Not on file   Number of children: 1   Years of education: Not on file   Highest education level: Not on file  Occupational History   Not on file  Tobacco Use   Smoking status: Never   Smokeless tobacco: Never  Vaping Use   Vaping Use: Never used  Substance and Sexual Activity   Alcohol use: No   Drug use: No   Sexual activity: Not Currently    Comment: divorced  Other Topics Concern   Not on file  Social History Narrative   Lives alone - talks to daughter and sister every day  Social Determinants of Health   Financial Resource Strain: Low Risk    Difficulty of Paying Living Expenses: Not hard at all  Food Insecurity: No Food Insecurity   Worried About Programme researcher, broadcasting/film/video in the Last Year: Never true   Ran Out of Food in the Last Year: Never true  Transportation Needs: No Transportation Needs   Lack of Transportation (Medical): No   Lack of Transportation (Non-Medical): No  Physical Activity: Sufficiently Active   Days of Exercise per Week: 3 days   Minutes of Exercise per Session: 60 min  Stress: No Stress Concern Present   Feeling of Stress : Not at all  Social  Connections: Moderately Integrated   Frequency of Communication with Friends and Family: More than three times a week   Frequency of Social Gatherings with Friends and Family: More than three times a week   Attends Religious Services: More than 4 times per year   Active Member of [employer] or Organizations: Yes   Attends Engineer, structural: More than 4 times per year   Marital Status: Divorced    Tobacco Counseling Counseling given: Not Answered   Clinical Intake:  Pre-visit preparation completed: Yes  Pain : 0-10 Pain Score: 2  Pain Type: Chronic pain Pain Location: Generalized Pain Descriptors / Indicators: Aching Pain Onset: More than a month ago Pain Frequency: Intermittent     BMI - recorded: 28.7 Nutritional Status: BMI 25 -29 Overweight Nutritional Risks: None Diabetes: No  How often do you need to have someone help you when you read instructions, pamphlets, or other written materials from your doctor or pharmacy?: 1 - Never  Diabetic? No  Interpreter Needed?: No  Information entered by :: Guelda Batson, LPN   Activities of Daily Living In your present state of health, do you have any difficulty performing the following activities: 09/21/2020  Hearing? N  Vision? N  Difficulty concentrating or making decisions? N  Walking or climbing stairs? N  Dressing or bathing? N  Doing errands, shopping? N  Preparing Food and eating ? N  Using the Toilet? N  In the past six months, have you accidently leaked urine? N  Do you have problems with loss of bowel control? N  Managing your Medications? N  Managing your Finances? N  Housekeeping or managing your Housekeeping? N  Some recent data might be hidden    Patient Care Team: Dettinger, Elige Radon, MD as PCP - General (Family Medicine)  Indicate any recent Medical Services you may have received from other than [facility] providers in the past year (date may be approximate).     Assessment:   This is a routine  wellness examination for [facility].  Hearing/Vision screen Hearing Screening - Comments:: Denies hearing difficulties  Vision Screening - Comments:: Wears eyeglasses - behind on annual eye exam with [facility] in [place] - has appt soon  Dietary issues and exercise activities discussed: Current Exercise Habits: Home exercise routine, Type of exercise: walking;strength training/weights, Time (Minutes): 60, Frequency (Times/Week): 3, Weekly Exercise (Minutes/Week): 180, Intensity: Moderate, Exercise limited by: None identified   Goals Addressed             This Visit's Progress    Exercise 3x per week (30 min per time)   On track      Depression Screen PHQ 2/9 Scores 09/21/2020 07/12/2020 04/05/2020 01/12/2020 12/23/2018  PHQ - 2 Score 0 0 0 0 0    Fall Risk Fall Risk  09/21/2020 07/12/2020 04/05/2020 01/12/2020  12/23/2018  Falls in the past year? 0 0 0 0 0  Number falls in past yr: 0 - - - -  Injury with Fall? 0 - - - -  Risk for fall due to : Impaired vision;Orthopedic patient - - - -  Follow up Falls prevention discussed - - - -    FALL RISK PREVENTION PERTAINING TO THE HOME:  Any stairs in or around the home? Yes  If so, are there any without handrails? No  Home free of loose throw rugs in walkways, pet beds, electrical cords, etc? Yes  Adequate lighting in your home to reduce risk of falls? Yes   ASSISTIVE DEVICES UTILIZED TO PREVENT FALLS:  Life alert? No  Use of a cane, walker or w/c? No  Grab bars in the bathroom? No  Shower chair or bench in shower? No  Elevated toilet seat or a handicapped toilet? No   TIMED UP AND GO:  Was the test performed? No . Telephonic visit  Cognitive Function:     6CIT Screen 09/21/2020  What Year? 0 points  What month? 0 points  What time? 0 points  Count back from 20 0 points  Months in reverse 0 points  Repeat phrase 0 points  Total Score 0    Immunizations Immunization History  Administered Date(s) Administered   Fluad Quad(high  Dose 65+) 12/23/2018, 01/12/2020   Moderna Sars-Covid-2 Vaccination 05/13/2019, 06/11/2019   Pneumococcal Conjugate-13 08/18/2018   Pneumococcal Polysaccharide-23 01/12/2020   Tdap 11/25/2008, 12/23/2018    TDAP status: Up to date  Flu Vaccine status: Up to date  Pneumococcal vaccine status: Up to date  Covid-19 vaccine status: Completed vaccines  Qualifies for Shingles Vaccine? Yes   Zostavax completed No   Shingrix Completed?: No.    Education has been provided regarding the importance of this vaccine. Patient has been advised to call insurance company to determine out of pocket expense if they have not yet received this vaccine. Advised may also receive vaccine at local pharmacy or Health Dept. Verbalized acceptance and understanding.  Screening Tests Health Maintenance  Topic Date Due   Zoster Vaccines- Shingrix (1 of 2) Never done   COVID-19 Vaccine (3 - Booster for Moderna series) 01/03/2021 (Originally 11/11/2019)   COLONOSCOPY (Pts 45-51yrs Insurance coverage will need to be confirmed)  01/11/2021 (Originally 05/13/1998)   Hepatitis C Screening  01/11/2021 (Originally 05/14/1973)   INFLUENZA VACCINE  10/16/2020   TETANUS/TDAP  12/22/2028   PNA vac Low Risk Adult  Completed   HPV VACCINES  Aged Out    Health Maintenance  Health Maintenance Due  Topic Date Due   Zoster Vaccines- Shingrix (1 of 2) Never done    Colorectal cancer screening: Type of screening: Colonoscopy. Completed 07/28/2015. Repeat every 5 years  Lung Cancer Screening: (Low Dose CT Chest recommended if Age 70-80 years, 30 pack-year currently smoking OR have quit w/in 15years.) does not qualify.   Additional Screening:  Hepatitis C Screening: does qualify; Needs this drawn  Vision Screening: Recommended annual ophthalmology exams for early detection of glaucoma and other disorders of the eye. Is the patient up to date with their annual eye exam?  No  Who is the provider or what is the name of the  office in which the patient attends annual eye exams? [facility] If pt is not established with a provider, would they like to be referred to a provider to establish care? No .   Dental Screening: Recommended annual dental exams for  proper oral hygiene  Community Resource Referral / Chronic Care Management: CRR required this visit?  No   CCM required this visit?  No      Plan:     I have personally reviewed and noted the following in the patient's chart:   Medical and social history Use of alcohol, tobacco or illicit drugs  Current medications and supplements including opioid prescriptions. Patient is not currently taking opioid prescriptions. Functional ability and status Nutritional status Physical activity Advanced directives List of other physicians Hospitalizations, surgeries, and ER visits in previous 12 months Vitals Screenings to include cognitive, depression, and falls Referrals and appointments  In addition, I have reviewed and discussed with patient certain preventive protocols, quality metrics, and best practice recommendations. A written personalized care plan for preventive services as well as general preventive health recommendations were provided to patient.     [place] Constablemy E Dequavius Kuhner, LPN   1/6/10967/09/2020   Nurse Notes: None

## 2020-09-21 NOTE — Patient Instructions (Signed)
Isaiah Cook , Thank you for taking time to come for your Medicare Wellness Visit. I appreciate your ongoing commitment to your health goals. Please review the following plan we discussed and let me know if I can assist you in the future.   Screening recommendations/referrals: Colonoscopy: Done 07/28/2015 - Repeat in 5 years - Has appointment Recommended yearly ophthalmology/optometry visit for glaucoma screening and checkup Recommended yearly dental visit for hygiene and checkup  Vaccinations: Influenza vaccine: Done 01/12/2020 - Repeat annually  Pneumococcal vaccine: Done 08/18/2018 & 01/12/2020 Tdap vaccine: Done 12/23/2018 - Repeat in 10 years  Shingles vaccine: Due. Shingrix discussed. Please contact your pharmacy for coverage information.     Covid-19: Done 05/13/19, 06/11/19 - need date of booster  Advanced directives: Advance directive discussed with you today. Even though you declined this today, please call our office should you change your mind, and we can give you the proper paperwork for you to fill out.   Conditions/risks identified: Keep up the great work! Aim for 30 minutes of exercise or brisk walking each day, drink 6-8 glasses of water and eat lots of fruits and vegetables.   Next appointment: Follow up in one year for your annual wellness visit.   Preventive Care 67 Years and Older, Male  Preventive care refers to lifestyle choices and visits with your health care provider that can promote health and wellness. What does preventive care include? A yearly physical exam. This is also called an annual well check. Dental exams once or twice a year. Routine eye exams. Ask your health care provider how often you should have your eyes checked. Personal lifestyle choices, including: Daily care of your teeth and gums. Regular physical activity. Eating a healthy diet. Avoiding tobacco and drug use. Limiting alcohol use. Practicing safe sex. Taking low doses of aspirin every  day. Taking vitamin and mineral supplements as recommended by your health care provider. What happens during an annual well check? The services and screenings done by your health care provider during your annual well check will depend on your age, overall health, lifestyle risk factors, and family history of disease. Counseling  Your health care provider may ask you questions about your: Alcohol use. Tobacco use. Drug use. Emotional well-being. Home and relationship well-being. Sexual activity. Eating habits. History of falls. Memory and ability to understand (cognition). Work and work Astronomer. Screening  You may have the following tests or measurements: Height, weight, and BMI. Blood pressure. Lipid and cholesterol levels. These may be checked every 5 years, or more frequently if you are over 36 years old. Skin check. Lung cancer screening. You may have this screening every year starting at age 78 if you have a 30-pack-year history of smoking and currently smoke or have quit within the past 15 years. Fecal occult blood test (FOBT) of the stool. You may have this test every year starting at age 34. Flexible sigmoidoscopy or colonoscopy. You may have a sigmoidoscopy every 5 years or a colonoscopy every 10 years starting at age 83. Prostate cancer screening. Recommendations will vary depending on your family history and other risks. Hepatitis C blood test. Hepatitis B blood test. Sexually transmitted disease (STD) testing. Diabetes screening. This is done by checking your blood sugar (glucose) after you have not eaten for a while (fasting). You may have this done every 1-3 years. Abdominal aortic aneurysm (AAA) screening. You may need this if you are a current or former smoker. Osteoporosis. You may be screened starting at age 74 if  you are at high risk. Talk with your health care provider about your test results, treatment options, and if necessary, the need for more  tests. Vaccines  Your health care provider may recommend certain vaccines, such as: Influenza vaccine. This is recommended every year. Tetanus, diphtheria, and acellular pertussis (Tdap, Td) vaccine. You may need a Td booster every 10 years. Zoster vaccine. You may need this after age 35. Pneumococcal 13-valent conjugate (PCV13) vaccine. One dose is recommended after age 37. Pneumococcal polysaccharide (PPSV23) vaccine. One dose is recommended after age 2. Talk to your health care provider about which screenings and vaccines you need and how often you need them. This information is not intended to replace advice given to you by your health care provider. Make sure you discuss any questions you have with your health care provider. Document Released: 03/31/2015 Document Revised: 11/22/2015 Document Reviewed: 01/03/2015 Elsevier Interactive Patient Education  2017 [employer] Prevention in the Home Falls can cause injuries. They can happen to people of all ages. There are many things you can do to make your home safe and to help prevent falls. What can I do on the outside of my home? Regularly fix the edges of walkways and driveways and fix any cracks. Remove anything that might make you trip as you walk through a door, such as a raised step or threshold. Trim any bushes or trees on the path to your home. Use bright outdoor lighting. Clear any walking paths of anything that might make someone trip, such as rocks or tools. Regularly check to see if handrails are loose or broken. Make sure that both sides of any steps have handrails. Any raised decks and porches should have guardrails on the edges. Have any leaves, snow, or ice cleared regularly. Use sand or salt on walking paths during winter. Clean up any spills in your garage right away. This includes oil or grease spills. What can I do in the bathroom? Use night lights. Install grab bars by the toilet and in the tub and shower.  Do not use towel bars as grab bars. Use non-skid mats or decals in the tub or shower. If you need to sit down in the shower, use a plastic, non-slip stool. Keep the floor dry. Clean up any water that spills on the floor as soon as it happens. Remove soap buildup in the tub or shower regularly. Attach bath mats securely with double-sided non-slip rug tape. Do not have throw rugs and other things on the floor that can make you trip. What can I do in the bedroom? Use night lights. Make sure that you have a light by your bed that is easy to reach. Do not use any sheets or blankets that are too big for your bed. They should not hang down onto the floor. Have a firm chair that has side arms. You can use this for support while you get dressed. Do not have throw rugs and other things on the floor that can make you trip. What can I do in the kitchen? Clean up any spills right away. Avoid walking on wet floors. Keep items that you use a lot in easy-to-reach places. If you need to reach something above you, use a strong step stool that has a grab bar. Keep electrical cords out of the way. Do not use floor polish or wax that makes floors slippery. If you must use wax, use non-skid floor wax. Do not have throw rugs and other things on  the floor that can make you trip. What can I do with my stairs? Do not leave any items on the stairs. Make sure that there are handrails on both sides of the stairs and use them. Fix handrails that are broken or loose. Make sure that handrails are as long as the stairways. Check any carpeting to make sure that it is firmly attached to the stairs. Fix any carpet that is loose or worn. Avoid having throw rugs at the top or bottom of the stairs. If you do have throw rugs, attach them to the floor with carpet tape. Make sure that you have a light switch at the top of the stairs and the bottom of the stairs. If you do not have them, ask someone to add them for you. What else  can I do to help prevent falls? Wear shoes that: Do not have high heels. Have rubber bottoms. Are comfortable and fit you well. Are closed at the toe. Do not wear sandals. If you use a stepladder: Make sure that it is fully opened. Do not climb a closed stepladder. Make sure that both sides of the stepladder are locked into place. Ask someone to hold it for you, if possible. Clearly mark and make sure that you can see: Any grab bars or handrails. First and last steps. Where the edge of each step is. Use tools that help you move around (mobility aids) if they are needed. These include: Canes. Walkers. Scooters. Crutches. Turn on the lights when you go into a dark area. Replace any light bulbs as soon as they burn out. Set up your furniture so you have a clear path. Avoid moving your furniture around. If any of your floors are uneven, fix them. If there are any pets around you, be aware of where they are. Review your medicines with your doctor. Some medicines can make you feel dizzy. This can increase your chance of falling. Ask your doctor what other things that you can do to help prevent falls. This information is not intended to replace advice given to you by your health care provider. Make sure you discuss any questions you have with your health care provider. Document Released: 12/29/2008 Document Revised: 08/10/2015 Document Reviewed: 04/08/2014 Elsevier Interactive Patient Education  2017 [employer].

## 2020-11-15 DIAGNOSIS — Z800 Family history of malignant neoplasm of digestive organs: Secondary | ICD-10-CM | POA: Diagnosis not present

## 2020-11-15 DIAGNOSIS — Z86010 Personal history of colonic polyps: Secondary | ICD-10-CM | POA: Diagnosis not present

## 2021-01-11 ENCOUNTER — Ambulatory Visit (INDEPENDENT_AMBULATORY_CARE_PROVIDER_SITE_OTHER): Payer: Medicare Other | Admitting: Family Medicine

## 2021-01-11 ENCOUNTER — Encounter: Payer: Self-pay | Admitting: Family Medicine

## 2021-01-11 ENCOUNTER — Other Ambulatory Visit: Payer: Self-pay

## 2021-01-11 VITALS — BP 138/80 | HR 90 | Ht 70.0 in | Wt 210.0 lb

## 2021-01-11 DIAGNOSIS — E785 Hyperlipidemia, unspecified: Secondary | ICD-10-CM | POA: Diagnosis not present

## 2021-01-11 DIAGNOSIS — E559 Vitamin D deficiency, unspecified: Secondary | ICD-10-CM | POA: Diagnosis not present

## 2021-01-11 DIAGNOSIS — I10 Essential (primary) hypertension: Secondary | ICD-10-CM | POA: Diagnosis not present

## 2021-01-11 DIAGNOSIS — F3342 Major depressive disorder, recurrent, in full remission: Secondary | ICD-10-CM | POA: Diagnosis not present

## 2021-01-11 DIAGNOSIS — Z23 Encounter for immunization: Secondary | ICD-10-CM | POA: Diagnosis not present

## 2021-01-11 DIAGNOSIS — F411 Generalized anxiety disorder: Secondary | ICD-10-CM

## 2021-01-11 NOTE — Progress Notes (Signed)
BP 138/80   Pulse 90   Ht _0  (1.778 m)   Wt 210 lb (95.3 kg)   SpO2 98%   BMI 30.13 kg/m    Subjective:   Patient ID: Isaiah Cook, male    DOB: [birth year], 65 y.o.   MRN: [id number]  HPI: Isaiah Cook is a 65 y.o. male presenting on 01/11/2021 for Medical Management of Chronic Issues, Hyperlipidemia, and Hypertension   HPI Hypertension Patient is currently on telmisartan, and their blood pressure today is 138/80. Patient denies any lightheadedness or dizziness. Patient denies headaches, blurred vision, chest pains, shortness of breath, or weakness. Denies any side effects from medication and is content with current medication.   Hyperlipidemia Patient is coming in for recheck of his hyperlipidemia. The patient is currently taking pravastatin. They deny any issues with myalgias or history of liver damage from it. They deny any focal numbness or weakness or chest pain.   Anxiety depression recheck Patient currently takes Cymbalta for anxiety and depression and feels like he is doing well. Depression screen [facility] 2/9 01/11/2021 09/21/2020 07/12/2020 04/05/2020 01/12/2020  Decreased Interest 0 0 0 0 0  Down, Depressed, Hopeless 0 0 0 0 0  PHQ - 2 Score 0 0 0 0 0    Patient has vitamin D deficiency, will recheck levels today.  Relevant past medical, surgical, family and social history reviewed and updated as indicated. Interim medical history since our last visit reviewed. Allergies and medications reviewed and updated.  Review of Systems  Constitutional:  Negative for chills and fever.  Eyes:  Negative for visual disturbance.  Respiratory:  Negative for shortness of breath and wheezing.   Cardiovascular:  Negative for chest pain and leg swelling.  Musculoskeletal:  Negative for back pain and gait problem.  Skin:  Negative for rash.  Neurological:  Negative for dizziness, weakness and light-headedness.  All other systems reviewed and are negative.  Per HPI unless  specifically indicated above   Allergies as of 01/11/2021       Reactions   Penicillins Anaphylaxis, Rash        Medication List        Accurate as of January 11, 2021  9:54 AM. If you have any questions, ask your nurse or doctor.          DULoxetine 60 MG capsule Commonly known as: CYMBALTA Take 1 capsule (60 mg total) by mouth 2 (two) times daily.   fluticasone 50 MCG/ACT nasal spray Commonly known as: FLONASE Place 2 sprays into both nostrils daily.   ibuprofen 200 MG tablet Commonly known as: ADVIL Take 200-400 mg by mouth every 6 (six) hours as needed. Pain   neomycin-polymyxin-hydrocortisone OTIC solution Commonly known as: CORTISPORIN Place 3 drops into the left ear 4 (four) times daily.   pravastatin 40 MG tablet Commonly known as: PRAVACHOL TAKE ONE (1) TABLET EACH DAY   telmisartan 40 MG tablet Commonly known as: MICARDIS TAKE ONE (1) TABLET EACH DAY   vitamin C with rose hips 500 MG tablet Take 500 mg by mouth daily.   Zinc 50 MG Caps Take by mouth.         Objective:   BP 138/80   Pulse 90   Ht _1  (1.778 m)   Wt 210 lb (95.3 kg)   SpO2 98%   BMI 30.13 kg/m   Wt Readings from Last 3 Encounters:  01/11/21 210 lb (95.3 kg)  09/21/20 200 lb (90.7 kg)  07/12/20  209 lb (94.8 kg)    Physical Exam Vitals and nursing note reviewed.  Constitutional:      General: He is not in acute distress.    Appearance: He is well-developed. He is not diaphoretic.  Eyes:     General: No scleral icterus.       Right eye: No discharge.     Conjunctiva/sclera: Conjunctivae normal.     Pupils: Pupils are equal, round, and reactive to light.  Neck:     Thyroid: No thyromegaly.  Cardiovascular:     Rate and Rhythm: Normal rate and regular rhythm.     Heart sounds: Normal heart sounds. No murmur heard. Pulmonary:     Effort: Pulmonary effort is normal. No respiratory distress.     Breath sounds: Normal breath sounds. No wheezing.   Musculoskeletal:        General: Normal range of motion.     Cervical back: Neck supple.  Lymphadenopathy:     Cervical: No cervical adenopathy.  Skin:    General: Skin is warm and dry.     Findings: No rash.  Neurological:     Mental Status: He is alert and oriented to person, place, and time.     Coordination: Coordination normal.  Psychiatric:        Behavior: Behavior normal.      Assessment & Plan:   Problem List Items Addressed This Visit       Cardiovascular and Mediastinum   Essential hypertension - Primary   Relevant Orders   CBC with Differential/Platelet   CMP14+EGFR     Other   Vitamin D deficiency   Relevant Orders   VITAMIN D 25 Hydroxy (Vit-D Deficiency, Fractures)   Anxiety, generalized   Dyslipidemia   Relevant Orders   Lipid panel   Major depressive disorder   Relevant Orders   CBC with Differential/Platelet   Other Visit Diagnoses     Need for immunization against influenza       Relevant Orders   Flu Vaccine QUAD 78moIM (Fluarix, Fluzone & Alfiuria Quad PF) (Completed)       Continue current medication, no changes.  Follow up plan: Return in about 6 months (around 07/12/2021), or if symptoms worsen or fail to improve, for Hyperlipidemia and hypertension.  Counseling provided for all of the vaccine components Orders Placed This Encounter  Procedures   Flu Vaccine QUAD 632moM (Fluarix, Fluzone & Alfiuria Quad PF)   CBC with Differential/Platelet   CMP14+EGFR   Lipid panel   VITAMIN D 25 Hydroxy (Vit-D Deficiency, Fractures)     JoCaryl PinaMD [facility] 01/11/2021, 9:54 AM

## 2021-01-12 LAB — CBC WITH DIFFERENTIAL/PLATELET
Basophils Absolute: 0.1 10*3/uL (ref 0.0–0.2)
Basos: 1 %
EOS (ABSOLUTE): 0.2 10*3/uL (ref 0.0–0.4)
Eos: 4 %
Hematocrit: 45.9 % (ref 37.5–51.0)
Hemoglobin: 15.9 g/dL (ref 13.0–17.7)
Immature Grans (Abs): 0 10*3/uL (ref 0.0–0.1)
Immature Granulocytes: 0 %
Lymphocytes Absolute: 1.7 10*3/uL (ref 0.7–3.1)
Lymphs: 33 %
MCH: 29.9 pg (ref 26.6–33.0)
MCHC: 34.6 g/dL (ref 31.5–35.7)
MCV: 86 fL (ref 79–97)
Monocytes Absolute: 0.4 10*3/uL (ref 0.1–0.9)
Monocytes: 8 %
Neutrophils Absolute: 2.7 10*3/uL (ref 1.4–7.0)
Neutrophils: 54 %
Platelets: 167 10*3/uL (ref 150–450)
RBC: 5.32 x10E6/uL (ref 4.14–5.80)
RDW: 12.7 % (ref 11.6–15.4)
WBC: 5.1 10*3/uL (ref 3.4–10.8)

## 2021-01-12 LAB — CMP14+EGFR
ALT: 29 IU/L (ref 0–44)
AST: 26 IU/L (ref 0–40)
Albumin/Globulin Ratio: 2 (ref 1.2–2.2)
Albumin: 4.5 g/dL (ref 3.8–4.8)
Alkaline Phosphatase: 73 IU/L (ref 44–121)
BUN/Creatinine Ratio: 17 (ref 10–24)
BUN: 15 mg/dL (ref 8–27)
Bilirubin Total: 0.5 mg/dL (ref 0.0–1.2)
CO2: 24 mmol/L (ref 20–29)
Calcium: 9.2 mg/dL (ref 8.6–10.2)
Chloride: 101 mmol/L (ref 96–106)
Creatinine, Ser: 0.87 mg/dL (ref 0.76–1.27)
Globulin, Total: 2.2 g/dL (ref 1.5–4.5)
Glucose: 101 mg/dL — ABNORMAL HIGH (ref 70–99)
Potassium: 4.2 mmol/L (ref 3.5–5.2)
Sodium: 139 mmol/L (ref 134–144)
Total Protein: 6.7 g/dL (ref 6.0–8.5)
eGFR: 95 mL/min/{1.73_m2} (ref >59)

## 2021-01-12 LAB — LIPID PANEL
Chol/HDL Ratio: 3.9 ratio (ref 0.0–5.0)
Cholesterol, Total: 144 mg/dL (ref 100–199)
HDL: 37 mg/dL — ABNORMAL LOW (ref >39)
LDL Chol Calc (NIH): 77 mg/dL (ref 0–99)
Triglycerides: 173 mg/dL — ABNORMAL HIGH (ref 0–149)
VLDL Cholesterol Cal: 30 mg/dL (ref 5–40)

## 2021-01-12 LAB — VITAMIN D 25 HYDROXY (VIT D DEFICIENCY, FRACTURES): Vit D, 25-Hydroxy: 23.7 ng/mL — ABNORMAL LOW (ref 30.0–100.0)

## 2021-05-02 ENCOUNTER — Encounter: Payer: Self-pay | Admitting: *Deleted

## 2021-07-12 ENCOUNTER — Ambulatory Visit: Payer: Medicare Other | Admitting: Family Medicine

## 2021-07-26 ENCOUNTER — Other Ambulatory Visit: Payer: Self-pay | Admitting: Family Medicine

## 2021-07-26 DIAGNOSIS — E785 Hyperlipidemia, unspecified: Secondary | ICD-10-CM

## 2021-07-26 DIAGNOSIS — I10 Essential (primary) hypertension: Secondary | ICD-10-CM

## 2021-08-09 ENCOUNTER — Ambulatory Visit (INDEPENDENT_AMBULATORY_CARE_PROVIDER_SITE_OTHER): Payer: Medicare Other | Admitting: Family Medicine

## 2021-08-09 ENCOUNTER — Encounter: Payer: Self-pay | Admitting: Family Medicine

## 2021-08-09 VITALS — BP 132/72 | HR 84 | Temp 97.9°F | Ht 70.0 in | Wt 210.0 lb

## 2021-08-09 DIAGNOSIS — F411 Generalized anxiety disorder: Secondary | ICD-10-CM

## 2021-08-09 DIAGNOSIS — H6122 Impacted cerumen, left ear: Secondary | ICD-10-CM | POA: Diagnosis not present

## 2021-08-09 DIAGNOSIS — Z23 Encounter for immunization: Secondary | ICD-10-CM | POA: Diagnosis not present

## 2021-08-09 DIAGNOSIS — F3342 Major depressive disorder, recurrent, in full remission: Secondary | ICD-10-CM

## 2021-08-09 DIAGNOSIS — E785 Hyperlipidemia, unspecified: Secondary | ICD-10-CM | POA: Diagnosis not present

## 2021-08-09 DIAGNOSIS — H9202 Otalgia, left ear: Secondary | ICD-10-CM | POA: Diagnosis not present

## 2021-08-09 DIAGNOSIS — I10 Essential (primary) hypertension: Secondary | ICD-10-CM

## 2021-08-09 DIAGNOSIS — E559 Vitamin D deficiency, unspecified: Secondary | ICD-10-CM

## 2021-08-09 MED ORDER — PRAVASTATIN SODIUM 40 MG PO TABS: ORAL_TABLET | ORAL | 1 refills | Status: DC

## 2021-08-09 MED ORDER — FLUTICASONE PROPIONATE 50 MCG/ACT NA SUSP: 2.0000 | Freq: Every day | NASAL | 3 refills | Status: DC

## 2021-08-09 MED ORDER — NEOMYCIN-POLYMYXIN-HC 3.5-10000-1 OT SOLN: 3.0000 [drp] | Freq: Four times a day (QID) | OTIC | 3 refills | Status: DC

## 2021-08-09 MED ORDER — TELMISARTAN 40 MG PO TABS: ORAL_TABLET | ORAL | 3 refills | Status: DC

## 2021-08-09 MED ORDER — DULOXETINE HCL 60 MG PO CPEP: 60.0000 mg | ORAL_CAPSULE | Freq: Two times a day (BID) | ORAL | 3 refills | Status: DC

## 2021-08-09 NOTE — Progress Notes (Signed)
BP 132/72   Pulse 84   Temp 97.9 F (36.6 C)   Ht _0  (1.778 m)   Wt 210 lb (95.3 kg)   SpO2 95%   BMI 30.13 kg/m    Subjective:   Patient ID: Isaiah Cook, male    DOB: [birth year], 66 y.o.   MRN: [id number]  HPI: TYRICE HEWITT is a 66 y.o. male presenting on 08/09/2021 for No chief complaint on file.   HPI Hypertension Patient is currently on telmisartan, and their blood pressure today is 132/72. Patient denies any lightheadedness or dizziness. Patient denies headaches, blurred vision, chest pains, shortness of breath, or weakness. Denies any side effects from medication and is content with current medication.   Hyperlipidemia Patient is coming in for recheck of his hyperlipidemia. The patient is currently taking pravastatin. They deny any issues with myalgias or history of liver damage from it. They deny any focal numbness or weakness or chest pain.   Anxiety and depression recheck Patient is coming in for anxiety depression recheck.  He currently take Cymbalta and feels like it does well for him.  He has been very happy to take 1 in the morning 1 in the evening and denies any side effects from it.  He denies any suicidal ideations or thoughts of hurting himself.    08/09/2021    8:44 AM 01/11/2021    9:25 AM 09/21/2020    9:11 AM 07/12/2020   10:17 AM 04/05/2020    3:28 PM  Depression screen PHQ 2/9  Decreased Interest 0 0 0 0 0  Down, Depressed, Hopeless 0 0 0 0 0  PHQ - 2 Score 0 0 0 0 0  Altered sleeping 0      Tired, decreased energy 0      Change in appetite 0      Feeling bad or failure about yourself  0      Trouble concentrating 0      Moving slowly or fidgety/restless 0      Suicidal thoughts 0      PHQ-9 Score 0         Vitamin D deficiency recheck Patient is coming in for a recheck of vit d deficiency and is doing well  Relevant past medical, surgical, family and social history reviewed and updated as indicated. Interim medical history since our  last visit reviewed. Allergies and medications reviewed and updated.  Review of Systems  Constitutional:  Negative for chills and fever.  Eyes:  Negative for visual disturbance.  Respiratory:  Negative for shortness of breath and wheezing.   Cardiovascular:  Negative for chest pain and leg swelling.  Musculoskeletal:  Negative for back pain and gait problem.  Skin:  Negative for rash.  Neurological:  Negative for dizziness, weakness and light-headedness.  All other systems reviewed and are negative.  Per HPI unless specifically indicated above   Allergies as of 08/09/2021       Reactions   Penicillins Anaphylaxis, Rash        Medication List        Accurate as of Aug 09, 2021  9:13 AM. If you have any questions, ask your nurse or doctor.          DULoxetine 60 MG capsule Commonly known as: CYMBALTA Take 1 capsule (60 mg total) by mouth 2 (two) times daily.   fluticasone 50 MCG/ACT nasal spray Commonly known as: FLONASE Place 2 sprays into both nostrils daily.   ibuprofen 200  MG tablet Commonly known as: ADVIL Take 200-400 mg by mouth every 6 (six) hours as needed. Pain   neomycin-polymyxin-hydrocortisone OTIC solution Commonly known as: CORTISPORIN Place 3 drops into the left ear 4 (four) times daily.   pravastatin 40 MG tablet Commonly known as: PRAVACHOL Take one tablet daily What changed: See the new instructions. Changed by: Fransisca Kaufmann Alissah Redmon, MD   telmisartan 40 MG tablet Commonly known as: MICARDIS Take one tablet daily What changed: See the new instructions. Changed by: Fransisca Kaufmann Arihant Pennings, MD   vitamin C with rose hips 500 MG tablet Take 500 mg by mouth daily.   Zinc 50 MG Caps Take by mouth.         Objective:   BP 132/72   Pulse 84   Temp 97.9 F (36.6 C)   Ht _0  (1.778 m)   Wt 210 lb (95.3 kg)   SpO2 95%   BMI 30.13 kg/m   Wt Readings from Last 3 Encounters:  08/09/21 210 lb (95.3 kg)  01/11/21 210 lb (95.3 kg)   09/21/20 200 lb (90.7 kg)    Physical Exam Vitals and nursing note reviewed.  Constitutional:      General: He is not in acute distress.    Appearance: He is well-developed. He is not diaphoretic.  Eyes:     General: No scleral icterus.    Conjunctiva/sclera: Conjunctivae normal.  Neck:     Thyroid: No thyromegaly.  Cardiovascular:     Rate and Rhythm: Normal rate and regular rhythm.     Heart sounds: Normal heart sounds. No murmur heard. Pulmonary:     Effort: Pulmonary effort is normal. No respiratory distress.     Breath sounds: Normal breath sounds. No wheezing.  Musculoskeletal:        General: No swelling. Normal range of motion.     Cervical back: Neck supple.  Lymphadenopathy:     Cervical: No cervical adenopathy.  Skin:    General: Skin is warm and dry.     Findings: No rash.  Neurological:     Mental Status: He is alert and oriented to person, place, and time.     Coordination: Coordination normal.  Psychiatric:        Behavior: Behavior normal.      Assessment & Plan:   Problem List Items Addressed This Visit       Cardiovascular and Mediastinum   Essential hypertension - Primary   Relevant Medications   pravastatin (PRAVACHOL) 40 MG tablet   telmisartan (MICARDIS) 40 MG tablet   Other Relevant Orders   CBC with Differential/Platelet   CMP14+EGFR   Lipid panel   VITAMIN D 25 Hydroxy (Vit-D Deficiency, Fractures)     Other   Anxiety, generalized   Relevant Medications   DULoxetine (CYMBALTA) 60 MG capsule   Dyslipidemia   Relevant Medications   pravastatin (PRAVACHOL) 40 MG tablet   Other Relevant Orders   CBC with Differential/Platelet   CMP14+EGFR   Lipid panel   VITAMIN D 25 Hydroxy (Vit-D Deficiency, Fractures)   Major depressive disorder   Relevant Medications   DULoxetine (CYMBALTA) 60 MG capsule   Vitamin D deficiency   Relevant Orders   CBC with Differential/Platelet   CMP14+EGFR   Lipid panel   VITAMIN D 25 Hydroxy (Vit-D  Deficiency, Fractures)   Other Visit Diagnoses     Left ear pain       Relevant Medications   neomycin-polymyxin-hydrocortisone (CORTISPORIN) OTIC solution   Impacted cerumen  of left ear       Relevant Medications   neomycin-polymyxin-hydrocortisone (CORTISPORIN) OTIC solution   Need for shingles vaccine       Relevant Orders   Varicella-zoster vaccine IM (Shingrix)       Seems to be doing well, continue current medicine, no changes.  We will check blood work. Follow up plan: Return if symptoms worsen or fail to improve.  Counseling provided for all of the vaccine components Orders Placed This Encounter  Procedures   Varicella-zoster vaccine IM (Shingrix)   CBC with Differential/Platelet   CMP14+EGFR   Lipid panel   VITAMIN D 25 Hydroxy (Vit-D Deficiency, Fractures)    Caryl Pina, MD [facility] 08/09/2021, 9:13 AM

## 2021-08-10 LAB — CBC WITH DIFFERENTIAL/PLATELET
Basophils Absolute: 0.1 10*3/uL (ref 0.0–0.2)
Basos: 1 %
EOS (ABSOLUTE): 0.5 10*3/uL — ABNORMAL HIGH (ref 0.0–0.4)
Eos: 8 %
Hematocrit: 47.1 % (ref 37.5–51.0)
Hemoglobin: 16.3 g/dL (ref 13.0–17.7)
Immature Grans (Abs): 0 10*3/uL (ref 0.0–0.1)
Immature Granulocytes: 0 %
Lymphocytes Absolute: 1.9 10*3/uL (ref 0.7–3.1)
Lymphs: 32 %
MCH: 30.4 pg (ref 26.6–33.0)
MCHC: 34.6 g/dL (ref 31.5–35.7)
MCV: 88 fL (ref 79–97)
Monocytes Absolute: 0.6 10*3/uL (ref 0.1–0.9)
Monocytes: 11 %
Neutrophils Absolute: 2.9 10*3/uL (ref 1.4–7.0)
Neutrophils: 48 %
Platelets: 171 10*3/uL (ref 150–450)
RBC: 5.37 x10E6/uL (ref 4.14–5.80)
RDW: 13 % (ref 11.6–15.4)
WBC: 6 10*3/uL (ref 3.4–10.8)

## 2021-08-10 LAB — CMP14+EGFR
ALT: 36 IU/L (ref 0–44)
AST: 29 IU/L (ref 0–40)
Albumin/Globulin Ratio: 2.4 — ABNORMAL HIGH (ref 1.2–2.2)
Albumin: 4.7 g/dL (ref 3.8–4.8)
Alkaline Phosphatase: 72 IU/L (ref 44–121)
BUN/Creatinine Ratio: 19 (ref 10–24)
BUN: 16 mg/dL (ref 8–27)
Bilirubin Total: 0.6 mg/dL (ref 0.0–1.2)
CO2: 23 mmol/L (ref 20–29)
Calcium: 9.4 mg/dL (ref 8.6–10.2)
Chloride: 100 mmol/L (ref 96–106)
Creatinine, Ser: 0.85 mg/dL (ref 0.76–1.27)
Globulin, Total: 2 g/dL (ref 1.5–4.5)
Glucose: 105 mg/dL — ABNORMAL HIGH (ref 70–99)
Potassium: 4.1 mmol/L (ref 3.5–5.2)
Sodium: 139 mmol/L (ref 134–144)
Total Protein: 6.7 g/dL (ref 6.0–8.5)
eGFR: 95 mL/min/{1.73_m2} (ref >59)

## 2021-08-10 LAB — LIPID PANEL
Chol/HDL Ratio: 4.5 ratio (ref 0.0–5.0)
Cholesterol, Total: 158 mg/dL (ref 100–199)
HDL: 35 mg/dL — ABNORMAL LOW (ref >39)
LDL Chol Calc (NIH): 84 mg/dL (ref 0–99)
Triglycerides: 233 mg/dL — ABNORMAL HIGH (ref 0–149)
VLDL Cholesterol Cal: 39 mg/dL (ref 5–40)

## 2021-08-10 LAB — VITAMIN D 25 HYDROXY (VIT D DEFICIENCY, FRACTURES): Vit D, 25-Hydroxy: 31.7 ng/mL (ref 30.0–100.0)

## 2021-09-24 ENCOUNTER — Ambulatory Visit (INDEPENDENT_AMBULATORY_CARE_PROVIDER_SITE_OTHER): Payer: Medicare Other

## 2021-09-24 VITALS — Wt 210.0 lb

## 2021-09-24 DIAGNOSIS — Z0000 Encounter for general adult medical examination without abnormal findings: Secondary | ICD-10-CM

## 2021-09-24 DIAGNOSIS — Z860100 Personal history of colon polyps, unspecified: Secondary | ICD-10-CM | POA: Insufficient documentation

## 2021-09-24 NOTE — Patient Instructions (Signed)
Isaiah Cook , Thank you for taking time to come for your Medicare Wellness Visit. I appreciate your ongoing commitment to your health goals. Please review the following plan we discussed and let me know if I can assist you in the future.   Screening recommendations/referrals: Colonoscopy: Done 11/15/2020 - Repeat in 5 years  Recommended yearly ophthalmology/optometry visit for glaucoma screening and checkup Recommended yearly dental visit for hygiene and checkup  Vaccinations: Influenza vaccine: Done 01/11/2021 - Repeat annually  Pneumococcal vaccine: Done 08/18/2018 & 01/12/2020 Tdap vaccine: Done 12/23/2018 - Repeat in 10 years  Shingles vaccine: Done 08/09/2021 - get second dose at next visit   Covid-19: Done 05/13/2019 & 06/11/2019 - for boosters, contact pharmacy  Advanced directives: Please bring a copy of your health care power of attorney and living will to the office to be added to your chart at your convenience.   Conditions/risks identified: Aim for 30 minutes of exercise or brisk walking, 6-8 glasses of water, and 5 servings of fruits and vegetables each day.   Next appointment: Follow up in one year for your annual wellness visit.   Preventive Care 80 Years and Older, Male  Preventive care refers to lifestyle choices and visits with your health care provider that can promote health and wellness. What does preventive care include? A yearly physical exam. This is also called an annual well check. Dental exams once or twice a year. Routine eye exams. Ask your health care provider how often you should have your eyes checked. Personal lifestyle choices, including: Daily care of your teeth and gums. Regular physical activity. Eating a healthy diet. Avoiding tobacco and drug use. Limiting alcohol use. Practicing safe sex. Taking low doses of aspirin every day. Taking vitamin and mineral supplements as recommended by your health care provider. What happens during an annual well  check? The services and screenings done by your health care provider during your annual well check will depend on your age, overall health, lifestyle risk factors, and family history of disease. Counseling  Your health care provider may ask you questions about your: Alcohol use. Tobacco use. Drug use. Emotional well-being. Home and relationship well-being. Sexual activity. Eating habits. History of falls. Memory and ability to understand (cognition). Work and work Astronomer. Screening  You may have the following tests or measurements: Height, weight, and BMI. Blood pressure. Lipid and cholesterol levels. These may be checked every 5 years, or more frequently if you are over 37 years old. Skin check. Lung cancer screening. You may have this screening every year starting at age 41 if you have a 30-pack-year history of smoking and currently smoke or have quit within the past 15 years. Fecal occult blood test (FOBT) of the stool. You may have this test every year starting at age 28. Flexible sigmoidoscopy or colonoscopy. You may have a sigmoidoscopy every 5 years or a colonoscopy every 10 years starting at age 74. Prostate cancer screening. Recommendations will vary depending on your family history and other risks. Hepatitis C blood test. Hepatitis B blood test. Sexually transmitted disease (STD) testing. Diabetes screening. This is done by checking your blood sugar (glucose) after you have not eaten for a while (fasting). You may have this done every 1-3 years. Abdominal aortic aneurysm (AAA) screening. You may need this if you are a current or former smoker. Osteoporosis. You may be screened starting at age 60 if you are at high risk. Talk with your health care provider about your test results, treatment options, and  if necessary, the need for more tests. Vaccines  Your health care provider may recommend certain vaccines, such as: Influenza vaccine. This is recommended every  year. Tetanus, diphtheria, and acellular pertussis (Tdap, Td) vaccine. You may need a Td booster every 10 years. Zoster vaccine. You may need this after age 15. Pneumococcal 13-valent conjugate (PCV13) vaccine. One dose is recommended after age 76. Pneumococcal polysaccharide (PPSV23) vaccine. One dose is recommended after age 19. Talk to your health care provider about which screenings and vaccines you need and how often you need them. This information is not intended to replace advice given to you by your health care provider. Make sure you discuss any questions you have with your health care provider. Document Released: 03/31/2015 Document Revised: 11/22/2015 Document Reviewed: 01/03/2015 Elsevier Interactive Patient Education  2017 [place] Prevention in the Home Falls can cause injuries. They can happen to people of all ages. There are many things you can do to make your home safe and to help prevent falls. What can I do on the outside of my home? Regularly fix the edges of walkways and driveways and fix any cracks. Remove anything that might make you trip as you walk through a door, such as a raised step or threshold. Trim any bushes or trees on the path to your home. Use bright outdoor lighting. Clear any walking paths of anything that might make someone trip, such as rocks or tools. Regularly check to see if handrails are loose or broken. Make sure that both sides of any steps have handrails. Any raised decks and porches should have guardrails on the edges. Have any leaves, snow, or ice cleared regularly. Use sand or salt on walking paths during winter. Clean up any spills in your garage right away. This includes oil or grease spills. What can I do in the bathroom? Use night lights. Install grab bars by the toilet and in the tub and shower. Do not use towel bars as grab bars. Use non-skid mats or decals in the tub or shower. If you need to sit down in the shower, use a  plastic, non-slip stool. Keep the floor dry. Clean up any water that spills on the floor as soon as it happens. Remove soap buildup in the tub or shower regularly. Attach bath mats securely with double-sided non-slip rug tape. Do not have throw rugs and other things on the floor that can make you trip. What can I do in the bedroom? Use night lights. Make sure that you have a light by your bed that is easy to reach. Do not use any sheets or blankets that are too big for your bed. They should not hang down onto the floor. Have a firm chair that has side arms. You can use this for support while you get dressed. Do not have throw rugs and other things on the floor that can make you trip. What can I do in the kitchen? Clean up any spills right away. Avoid walking on wet floors. Keep items that you use a lot in easy-to-reach places. If you need to reach something above you, use a strong step stool that has a grab bar. Keep electrical cords out of the way. Do not use floor polish or wax that makes floors slippery. If you must use wax, use non-skid floor wax. Do not have throw rugs and other things on the floor that can make you trip. What can I do with my stairs? Do not leave any  items on the stairs. Make sure that there are handrails on both sides of the stairs and use them. Fix handrails that are broken or loose. Make sure that handrails are as long as the stairways. Check any carpeting to make sure that it is firmly attached to the stairs. Fix any carpet that is loose or worn. Avoid having throw rugs at the top or bottom of the stairs. If you do have throw rugs, attach them to the floor with carpet tape. Make sure that you have a light switch at the top of the stairs and the bottom of the stairs. If you do not have them, ask someone to add them for you. What else can I do to help prevent falls? Wear shoes that: Do not have high heels. Have rubber bottoms. Are comfortable and fit you  well. Are closed at the toe. Do not wear sandals. If you use a stepladder: Make sure that it is fully opened. Do not climb a closed stepladder. Make sure that both sides of the stepladder are locked into place. Ask someone to hold it for you, if possible. Clearly mark and make sure that you can see: Any grab bars or handrails. First and last steps. Where the edge of each step is. Use tools that help you move around (mobility aids) if they are needed. These include: Canes. Walkers. Scooters. Crutches. Turn on the lights when you go into a dark area. Replace any light bulbs as soon as they burn out. Set up your furniture so you have a clear path. Avoid moving your furniture around. If any of your floors are uneven, fix them. If there are any pets around you, be aware of where they are. Review your medicines with your doctor. Some medicines can make you feel dizzy. This can increase your chance of falling. Ask your doctor what other things that you can do to help prevent falls. This information is not intended to replace advice given to you by your health care provider. Make sure you discuss any questions you have with your health care provider. Document Released: 12/29/2008 Document Revised: 08/10/2015 Document Reviewed: 04/08/2014 Elsevier Interactive Patient Education  2017 [employer].

## 2021-09-24 NOTE — Progress Notes (Signed)
Subjective:   Isaiah Cook is a 66 y.o. male who presents for Medicare Annual/Subsequent preventive examination.  Virtual Visit via Telephone Note  I connected with  Isaiah Cook on 09/24/21 at  9:45 AM EDT by telephone and verified that I am speaking with the correct person using two identifiers.  Location: Patient: Home Provider: [facility] Persons participating in the virtual visit: patient/Nurse Health Advisor   I discussed the limitations, risks, security and privacy concerns of performing an evaluation and management service by telephone and the availability of in person appointments. The patient expressed understanding and agreed to proceed.  Interactive audio and video telecommunications were attempted between this nurse and patient, however failed, due to patient having technical difficulties OR patient did not have access to video capability.  We continued and completed visit with audio only.  Some vital signs may be absent or patient reported.   Noriel Guthrie E Mekai Wilkinson, LPN   Review of Systems     Cardiac Risk Factors include: advanced age (>65men, >42 women);dyslipidemia;hypertension;male gender;obesity (BMI >30kg/m2)     Objective:    Today's Vitals   09/24/21 0953  Weight: 210 lb (95.3 kg)   Body mass index is 30.13 kg/m.     09/24/2021    9:59 AM 09/21/2020    9:13 AM  Advanced Directives  Does Patient Have a Medical Advance Directive? Yes No  Type of Estate agent of [place];Living will   Copy of Healthcare Power of Attorney in Chart? No - copy requested   Would patient like information on creating a medical advance directive?  No - Patient declined    Current Medications (verified) Outpatient Encounter Medications as of 09/24/2021  Medication Sig   Ascorbic Acid (VITAMIN C WITH ROSE HIPS) 500 MG tablet Take 500 mg by mouth daily.   DULoxetine (CYMBALTA) 60 MG capsule Take 1 capsule (60 mg total) by mouth 2 (two) times daily.    fluticasone (FLONASE) 50 MCG/ACT nasal spray Place 2 sprays into both nostrils daily.   ibuprofen (ADVIL,MOTRIN) 200 MG tablet Take 200-400 mg by mouth every 6 (six) hours as needed. Pain   pravastatin (PRAVACHOL) 40 MG tablet Take one tablet daily   telmisartan (MICARDIS) 40 MG tablet Take one tablet daily   Zinc 50 MG CAPS Take by mouth.   neomycin-polymyxin-hydrocortisone (CORTISPORIN) OTIC solution Place 3 drops into the left ear 4 (four) times daily. (Patient not taking: Reported on 09/24/2021)   No facility-administered encounter medications on file as of 09/24/2021.    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   Family History  Problem Relation Age of Onset   Cancer Mother        colon   Alzheimer's disease Mother    Heart attack Father    Social History   Socioeconomic History   Marital status: Divorced    Spouse name: Not on file   Number of children: 1   Years of education: Not on file   Highest education level: Not on file  Occupational History   Occupation: retired  Tobacco Use   Smoking status: Never   Smokeless tobacco: Never  Vaping Use   Vaping Use: Never used  Substance and Sexual Activity   Alcohol use: No   Drug use: No   Sexual activity:  Not Currently    Comment: divorced  Other Topics Concern   Not on file  Social History Narrative   Lives alone - talks to daughter and sister every day   Daughter lives in [place], [place] - he flies out to see her every few months   Social Determinants of Health   Financial Resource Strain: Low Risk  (09/24/2021)   Overall Financial Resource Strain (CARDIA)    Difficulty of Paying Living Expenses: Not hard at all  Food Insecurity: No Food Insecurity (09/24/2021)   Hunger Vital Sign    Worried About Running Out of Food in the Last Year:  Never true    Ran Out of Food in the Last Year: Never true  Transportation Needs: No Transportation Needs (09/24/2021)   PRAPARE - Administrator, Civil Service (Medical): No    Lack of Transportation (Non-Medical): No  Physical Activity: Sufficiently Active (09/24/2021)   Exercise Vital Sign    Days of Exercise per Week: 3 days    Minutes of Exercise per Session: 60 min  Stress: No Stress Concern Present (09/24/2021)   [employer] of Occupational Health - Occupational Stress Questionnaire    Feeling of Stress : Not at all  Social Connections: Moderately Integrated (09/24/2021)   Social Connection and Isolation Panel [NHANES]    Frequency of Communication with Friends and Family: More than three times a week    Frequency of Social Gatherings with Friends and Family: More than three times a week    Attends Religious Services: More than 4 times per year    Active Member of [employer] or Organizations: Yes    Attends Engineer, structural: More than 4 times per year    Marital Status: Divorced    Tobacco Counseling Counseling given: Not Answered   Clinical Intake:  Pre-visit preparation completed: Yes  Pain : No/denies pain     BMI - recorded: 30.13 Nutritional Status: BMI > 30  Obese Nutritional Risks: None Diabetes: No  How often do you need to have someone help you when you read instructions, pamphlets, or other written materials from your doctor or pharmacy?: 1 - Never  Diabetic? no  Interpreter Needed?: No  Information entered by :: Chantel Teti, LPN   Activities of Daily Living    09/24/2021    9:59 AM  In your present state of health, do you have any difficulty performing the following activities:  Hearing? 0  Vision? 0  Difficulty concentrating or making decisions? 0  Walking or climbing stairs? 0  Dressing or bathing? 0  Doing errands, shopping? 0  Preparing Food and eating ? N  Using the Toilet? N  In the past six months, have you  accidently leaked urine? N  Do you have problems with loss of bowel control? N  Managing your Medications? N  Managing your Finances? N  Housekeeping or managing your Housekeeping? N    Patient Care Team: Dettinger, Elige Radon, MD as PCP - General (Family Medicine) Bernette Redbird, MD as Consulting Physician (Gastroenterology) Smitty Cords, OD (Optometry)  Indicate any recent Medical Services you may have received from other than [facility] providers in the past year (date may be approximate).     Assessment:   This is a routine wellness examination for Trice.  Hearing/Vision screen Hearing Screening - Comments:: Denies hearing difficulties   Vision Screening - Comments:: Wears rx glasses - up to date with routine eye exams with [facility]  Dietary issues and exercise activities  discussed: Current Exercise Habits: Home exercise routine, Type of exercise: walking, Time (Minutes): 60, Frequency (Times/Week): 3, Weekly Exercise (Minutes/Week): 180, Intensity: Mild, Exercise limited by: None identified   Goals Addressed             This Visit's Progress    Exercise 3x per week (30 min per time)   On track      Depression Screen    09/24/2021    9:57 AM 08/09/2021    8:44 AM 01/11/2021    9:25 AM 09/21/2020    9:11 AM 07/12/2020   10:17 AM 04/05/2020    3:28 PM 01/12/2020   10:42 AM  PHQ 2/9 Scores  PHQ - 2 Score 0 0 0 0 0 0 0  PHQ- 9 Score 0 0         Fall Risk    09/24/2021    9:55 AM 08/09/2021    8:44 AM 01/11/2021    9:25 AM 09/21/2020    9:16 AM 07/12/2020   10:17 AM  Fall Risk   Falls in the past year? 0 0 0 0 0  Number falls in past yr: 0   0   Injury with Fall? 0   0   Risk for fall due to : No Fall Risks   Impaired vision;Orthopedic patient   Follow up Falls prevention discussed   Falls prevention discussed     FALL RISK PREVENTION PERTAINING TO THE HOME:  Any stairs in or around the home? No  If so, are there any without handrails? No  Home free of loose  throw rugs in walkways, pet beds, electrical cords, etc? Yes  Adequate lighting in your home to reduce risk of falls? Yes   ASSISTIVE DEVICES UTILIZED TO PREVENT FALLS:  Life alert? No  Use of a cane, walker or w/c? No  Grab bars in the bathroom? No  Shower chair or bench in shower? No  Elevated toilet seat or a handicapped toilet? No   TIMED UP AND GO:  Was the test performed? No . Telephonic visit  Cognitive Function:        09/24/2021   10:01 AM 09/21/2020    9:17 AM  6CIT Screen  What Year? 0 points 0 points  What month? 0 points 0 points  What time? 0 points 0 points  Count back from 20 0 points 0 points  Months in reverse 2 points 0 points  Repeat phrase 10 points 0 points  Total Score 12 points 0 points    Immunizations Immunization History  Administered Date(s) Administered   Fluad Quad(high Dose 65+) 12/23/2018, 01/12/2020   Influenza,inj,Quad PF,6+ Mos 01/11/2021   Moderna Sars-Covid-2 Vaccination 05/13/2019, 06/11/2019   Pneumococcal Conjugate-13 08/18/2018   Pneumococcal Polysaccharide-23 01/12/2020   Tdap 11/25/2008, 12/23/2018   Zoster Recombinat (Shingrix) 08/09/2021    TDAP status: Up to date  Flu Vaccine status: Up to date  Pneumococcal vaccine status: Up to date  Covid-19 vaccine status: Completed vaccines  Qualifies for Shingles Vaccine? Yes   Zostavax completed No   Shingrix Completed?: No.    Education has been provided regarding the importance of this vaccine. Patient has been advised to call insurance company to determine out of pocket expense if they have not yet received this vaccine. Advised may also receive vaccine at local pharmacy or Health Dept. Verbalized acceptance and understanding.  Screening Tests Health Maintenance  Topic Date Due   Hepatitis C Screening  08/10/2022 (Originally 05/14/1973)   COVID-19 Vaccine (3 -  Moderna series) 08/26/2022 (Originally 08/06/2019)   Zoster Vaccines- Shingrix (2 of 2) 10/04/2021   INFLUENZA  VACCINE  10/16/2021   COLONOSCOPY (Pts 45-63yrs Insurance coverage will need to be confirmed)  11/15/2025   TETANUS/TDAP  12/22/2028   Pneumonia Vaccine 45+ Years old  Completed   HPV VACCINES  Aged Out    Health Maintenance  There are no preventive care reminders to display for this patient.  Colorectal cancer screening: Type of screening: Colonoscopy. Completed 11/15/2020. Repeat every 5 years  Lung Cancer Screening: (Low Dose CT Chest recommended if Age 21-80 years, 30 pack-year currently smoking OR have quit w/in 15years.) does not qualify.   Additional Screening:  Hepatitis C Screening: does qualify; DUE  Vision Screening: Recommended annual ophthalmology exams for early detection of glaucoma and other disorders of the eye. Is the patient up to date with their annual eye exam?  Yes  Who is the provider or what is the name of the office in which the patient attends annual eye exams? [facility] If pt is not established with a provider, would they like to be referred to a provider to establish care? No .   Dental Screening: Recommended annual dental exams for proper oral hygiene  Community Resource Referral / Chronic Care Management: CRR required this visit?  No   CCM required this visit?  No      Plan:     I have personally reviewed and noted the following in the patient's chart:   Medical and social history Use of alcohol, tobacco or illicit drugs  Current medications and supplements including opioid prescriptions. Patient is not currently taking opioid prescriptions. Functional ability and status Nutritional status Physical activity Advanced directives List of other physicians Hospitalizations, surgeries, and ER visits in previous 12 months Vitals Screenings to include cognitive, depression, and falls Referrals and appointments  In addition, I have reviewed and discussed with patient certain preventive protocols, quality metrics, and best practice  recommendations. A written personalized care plan for preventive services as well as general preventive health recommendations were provided to patient.     [place] Constable, LPN   1/61/0960   Nurse Notes: None

## 2022-01-24 ENCOUNTER — Telehealth: Payer: Self-pay | Admitting: Family Medicine

## 2022-01-24 ENCOUNTER — Other Ambulatory Visit: Payer: Self-pay | Admitting: *Deleted

## 2022-01-24 DIAGNOSIS — E559 Vitamin D deficiency, unspecified: Secondary | ICD-10-CM

## 2022-01-24 DIAGNOSIS — I10 Essential (primary) hypertension: Secondary | ICD-10-CM

## 2022-01-24 DIAGNOSIS — Z125 Encounter for screening for malignant neoplasm of prostate: Secondary | ICD-10-CM

## 2022-01-24 DIAGNOSIS — E785 Hyperlipidemia, unspecified: Secondary | ICD-10-CM

## 2022-01-24 NOTE — Telephone Encounter (Signed)
Labs ordered, patient aware

## 2022-01-24 NOTE — Telephone Encounter (Signed)
Patient would like to have orders added for lab work so that he can come in before appt to get them done. Please call back when added.

## 2022-01-29 ENCOUNTER — Other Ambulatory Visit: Payer: Medicare Other

## 2022-01-29 DIAGNOSIS — E785 Hyperlipidemia, unspecified: Secondary | ICD-10-CM | POA: Diagnosis not present

## 2022-01-29 DIAGNOSIS — I10 Essential (primary) hypertension: Secondary | ICD-10-CM | POA: Diagnosis not present

## 2022-01-29 DIAGNOSIS — E559 Vitamin D deficiency, unspecified: Secondary | ICD-10-CM | POA: Diagnosis not present

## 2022-01-29 DIAGNOSIS — Z125 Encounter for screening for malignant neoplasm of prostate: Secondary | ICD-10-CM

## 2022-01-30 LAB — CBC WITH DIFFERENTIAL/PLATELET
Basophils Absolute: 0.1 10*3/uL (ref 0.0–0.2)
Basos: 1 %
EOS (ABSOLUTE): 0.3 10*3/uL (ref 0.0–0.4)
Eos: 4 %
Hematocrit: 45.6 % (ref 37.5–51.0)
Hemoglobin: 15.7 g/dL (ref 13.0–17.7)
Immature Grans (Abs): 0 10*3/uL (ref 0.0–0.1)
Immature Granulocytes: 0 %
Lymphocytes Absolute: 2.1 10*3/uL (ref 0.7–3.1)
Lymphs: 35 %
MCH: 29.6 pg (ref 26.6–33.0)
MCHC: 34.4 g/dL (ref 31.5–35.7)
MCV: 86 fL (ref 79–97)
Monocytes Absolute: 0.6 10*3/uL (ref 0.1–0.9)
Monocytes: 9 %
Neutrophils Absolute: 3 10*3/uL (ref 1.4–7.0)
Neutrophils: 51 %
Platelets: 192 10*3/uL (ref 150–450)
RBC: 5.31 x10E6/uL (ref 4.14–5.80)
RDW: 12.3 % (ref 11.6–15.4)
WBC: 6 10*3/uL (ref 3.4–10.8)

## 2022-01-30 LAB — VITAMIN D 25 HYDROXY (VIT D DEFICIENCY, FRACTURES): Vit D, 25-Hydroxy: 30 ng/mL (ref 30.0–100.0)

## 2022-01-30 LAB — LIPID PANEL
Chol/HDL Ratio: 3.5 ratio (ref 0.0–5.0)
Cholesterol, Total: 152 mg/dL (ref 100–199)
HDL: 44 mg/dL (ref >39)
LDL Chol Calc (NIH): 75 mg/dL (ref 0–99)
Triglycerides: 198 mg/dL — ABNORMAL HIGH (ref 0–149)
VLDL Cholesterol Cal: 33 mg/dL (ref 5–40)

## 2022-01-30 LAB — CMP14+EGFR
ALT: 28 IU/L (ref 0–44)
AST: 26 IU/L (ref 0–40)
Albumin/Globulin Ratio: 2.4 — ABNORMAL HIGH (ref 1.2–2.2)
Albumin: 4.7 g/dL (ref 3.9–4.9)
Alkaline Phosphatase: 79 IU/L (ref 44–121)
BUN/Creatinine Ratio: 18 (ref 10–24)
BUN: 17 mg/dL (ref 8–27)
Bilirubin Total: 0.6 mg/dL (ref 0.0–1.2)
CO2: 24 mmol/L (ref 20–29)
Calcium: 9.6 mg/dL (ref 8.6–10.2)
Chloride: 101 mmol/L (ref 96–106)
Creatinine, Ser: 0.97 mg/dL (ref 0.76–1.27)
Globulin, Total: 2 g/dL (ref 1.5–4.5)
Glucose: 108 mg/dL — ABNORMAL HIGH (ref 70–99)
Potassium: 4.2 mmol/L (ref 3.5–5.2)
Sodium: 141 mmol/L (ref 134–144)
Total Protein: 6.7 g/dL (ref 6.0–8.5)
eGFR: 85 mL/min/{1.73_m2} (ref >59)

## 2022-01-30 LAB — PSA, TOTAL AND FREE
PSA, Free Pct: 16.3 %
PSA, Free: 0.13 ng/mL
Prostate Specific Ag, Serum: 0.8 ng/mL (ref 0.0–4.0)

## 2022-02-06 ENCOUNTER — Ambulatory Visit (INDEPENDENT_AMBULATORY_CARE_PROVIDER_SITE_OTHER): Payer: Medicare Other | Admitting: Family Medicine

## 2022-02-06 ENCOUNTER — Encounter: Payer: Self-pay | Admitting: Family Medicine

## 2022-02-06 VITALS — BP 140/80 | HR 75 | Wt 211.0 lb

## 2022-02-06 DIAGNOSIS — E785 Hyperlipidemia, unspecified: Secondary | ICD-10-CM

## 2022-02-06 DIAGNOSIS — I10 Essential (primary) hypertension: Secondary | ICD-10-CM

## 2022-02-06 DIAGNOSIS — Z23 Encounter for immunization: Secondary | ICD-10-CM

## 2022-02-06 DIAGNOSIS — F411 Generalized anxiety disorder: Secondary | ICD-10-CM

## 2022-02-06 DIAGNOSIS — F3342 Major depressive disorder, recurrent, in full remission: Secondary | ICD-10-CM | POA: Diagnosis not present

## 2022-02-06 MED ORDER — PRAVASTATIN SODIUM 40 MG PO TABS: ORAL_TABLET | ORAL | 1 refills | Status: DC

## 2022-02-06 MED ORDER — TELMISARTAN 40 MG PO TABS: ORAL_TABLET | ORAL | 3 refills | Status: DC

## 2022-02-06 NOTE — Progress Notes (Signed)
BP (!) 140/80   Pulse 75   Wt 211 lb (95.7 kg)   SpO2 95%   BMI 30.28 kg/m    Subjective:   Patient ID: Isaiah Cook, male    DOB: 27-Mar-1955, 66 y.o.   MRN: [id number]  HPI: Isaiah Cook is a 66 y.o. male presenting on 02/06/2022 for Medical Management of Chronic Issues, Hyperlipidemia, and Hypertension   HPI Anxiety depression recheck Patient is coming in today for anxiety and depression recheck.  He currently takes Cymbalta and feels like he does well.  He denies any major issues.    02/06/2022    8:19 AM 09/24/2021    9:57 AM 08/09/2021    8:44 AM 01/11/2021    9:25 AM 09/21/2020    9:11 AM  Depression screen PHQ 2/9  Decreased Interest 0 0 0 0 0  Down, Depressed, Hopeless 0 0 0 0 0  PHQ - 2 Score 0 0 0 0 0  Altered sleeping 0 0 0    Tired, decreased energy 0 0 0    Change in appetite 0 0 0    Feeling bad or failure about yourself  0 0 0    Trouble concentrating 0 0 0    Moving slowly or fidgety/restless 0 0 0    Suicidal thoughts 0 0 0    PHQ-9 Score 0 0 0    Difficult doing work/chores Not difficult at all         Hyperlipidemia Patient is coming in for recheck of his hyperlipidemia. The patient is currently taking pravastatin. They deny any issues with myalgias or history of liver damage from it. They deny any focal numbness or weakness or chest pain.   Hypertension Patient is currently on losartan, and their blood pressure today is 140/80. Patient denies any lightheadedness or dizziness. Patient denies headaches, blurred vision, chest pains, shortness of breath, or weakness. Denies any side effects from medication and is content with current medication.   Relevant past medical, surgical, family and social history reviewed and updated as indicated. Interim medical history since our last visit reviewed. Allergies and medications reviewed and updated.  Review of Systems  Constitutional:  Negative for chills and fever.  Eyes:  Negative for visual  disturbance.  Respiratory:  Negative for shortness of breath and wheezing.   Cardiovascular:  Negative for chest pain and leg swelling.  Musculoskeletal:  Negative for back pain and gait problem.  Skin:  Negative for rash.  Neurological:  Negative for dizziness and light-headedness.  All other systems reviewed and are negative.   Per HPI unless specifically indicated above   Allergies as of 02/06/2022       Reactions   Penicillins Anaphylaxis, Rash        Medication List        Accurate as of February 06, 2022  8:32 AM. If you have any questions, ask your nurse or doctor.          DULoxetine 60 MG capsule Commonly known as: CYMBALTA Take 1 capsule (60 mg total) by mouth 2 (two) times daily.   fluticasone 50 MCG/ACT nasal spray Commonly known as: FLONASE Place 2 sprays into both nostrils daily.   ibuprofen 200 MG tablet Commonly known as: ADVIL Take 200-400 mg by mouth every 6 (six) hours as needed. Pain   neomycin-polymyxin-hydrocortisone OTIC solution Commonly known as: CORTISPORIN Place 3 drops into the left ear 4 (four) times daily.   pravastatin 40 MG tablet Commonly known  as: PRAVACHOL Take one tablet daily   telmisartan 40 MG tablet Commonly known as: MICARDIS Take one tablet daily   vitamin C with rose hips 500 MG tablet Take 500 mg by mouth daily.   Zinc 50 MG Caps Take by mouth.         Objective:   BP (!) 140/80   Pulse 75   Wt 211 lb (95.7 kg)   SpO2 95%   BMI 30.28 kg/m   Wt Readings from Last 3 Encounters:  02/06/22 211 lb (95.7 kg)  09/24/21 210 lb (95.3 kg)  08/09/21 210 lb (95.3 kg)    Physical Exam Vitals and nursing note reviewed.  Constitutional:      General: He is not in acute distress.    Appearance: He is well-developed. He is not diaphoretic.  Eyes:     General: No scleral icterus.    Conjunctiva/sclera: Conjunctivae normal.  Neck:     Thyroid: No thyromegaly.  Cardiovascular:     Rate and Rhythm: Normal  rate and regular rhythm.     Heart sounds: Normal heart sounds. No murmur heard. Pulmonary:     Effort: Pulmonary effort is normal. No respiratory distress.     Breath sounds: Normal breath sounds. No wheezing.  Musculoskeletal:        General: No swelling. Normal range of motion.     Cervical back: Neck supple.  Lymphadenopathy:     Cervical: No cervical adenopathy.  Skin:    General: Skin is warm and dry.     Findings: No rash.  Neurological:     Mental Status: He is alert and oriented to person, place, and time.     Coordination: Coordination normal.  Psychiatric:        Behavior: Behavior normal.     Results for orders placed or performed in visit on 01/29/22  PSA, total and free  Result Value Ref Range   Prostate Specific Ag, Serum 0.8 0.0 - 4.0 ng/mL   PSA, Free 0.13 N/A ng/mL   PSA, Free Pct 16.3 %  CBC with Differential/Platelet  Result Value Ref Range   WBC 6.0 3.4 - 10.8 x10E3/uL   RBC 5.31 4.14 - 5.80 x10E6/uL   Hemoglobin 15.7 13.0 - 17.7 g/dL   Hematocrit 45.6 37.5 - 51.0 %   MCV 86 79 - 97 fL   MCH 29.6 26.6 - 33.0 pg   MCHC 34.4 31.5 - 35.7 g/dL   RDW 12.3 11.6 - 15.4 %   Platelets 192 150 - 450 x10E3/uL   Neutrophils 51 Not Estab. %   Lymphs 35 Not Estab. %   Monocytes 9 Not Estab. %   Eos 4 Not Estab. %   Basos 1 Not Estab. %   Neutrophils Absolute 3.0 1.4 - 7.0 x10E3/uL   Lymphocytes Absolute 2.1 0.7 - 3.1 x10E3/uL   Monocytes Absolute 0.6 0.1 - 0.9 x10E3/uL   EOS (ABSOLUTE) 0.3 0.0 - 0.4 x10E3/uL   Basophils Absolute 0.1 0.0 - 0.2 x10E3/uL   Immature Granulocytes 0 Not Estab. %   Immature Grans (Abs) 0.0 0.0 - 0.1 x10E3/uL  CMP14+EGFR  Result Value Ref Range   Glucose 108 (H) 70 - 99 mg/dL   BUN 17 8 - 27 mg/dL   Creatinine, Ser 0.97 0.76 - 1.27 mg/dL   eGFR 85 >59 mL/min/1.73   BUN/Creatinine Ratio 18 10 - 24   Sodium 141 134 - 144 mmol/L   Potassium 4.2 3.5 - 5.2 mmol/L   Chloride 101  96 - 106 mmol/L   CO2 24 20 - 29 mmol/L   Calcium  9.6 8.6 - 10.2 mg/dL   Total Protein 6.7 6.0 - 8.5 g/dL   Albumin 4.7 3.9 - 4.9 g/dL   Globulin, Total 2.0 1.5 - 4.5 g/dL   Albumin/Globulin Ratio 2.4 (H) 1.2 - 2.2   Bilirubin Total 0.6 0.0 - 1.2 mg/dL   Alkaline Phosphatase 79 44 - 121 IU/L   AST 26 0 - 40 IU/L   ALT 28 0 - 44 IU/L  Lipid panel  Result Value Ref Range   Cholesterol, Total 152 100 - 199 mg/dL   Triglycerides 198 (H) 0 - 149 mg/dL   HDL 44 >39 mg/dL   VLDL Cholesterol Cal 33 5 - 40 mg/dL   LDL Chol Calc (NIH) 75 0 - 99 mg/dL   Chol/HDL Ratio 3.5 0.0 - 5.0 ratio  VITAMIN D 25 Hydroxy (Vit-D Deficiency, Fractures)  Result Value Ref Range   Vit D, 25-Hydroxy 30.0 30.0 - 100.0 ng/mL    Assessment & Plan:   Problem List Items Addressed This Visit       Cardiovascular and Mediastinum   Essential hypertension   Relevant Medications   pravastatin (PRAVACHOL) 40 MG tablet   telmisartan (MICARDIS) 40 MG tablet     Other   Anxiety, generalized   Dyslipidemia   Relevant Medications   pravastatin (PRAVACHOL) 40 MG tablet   Major depressive disorder - Primary    Seems to be doing well, blood pressure is borderline but otherwise doing well. Follow up plan: Return if symptoms worsen or fail to improve, for Hypertension and hyperlipidemia.  Counseling provided for all of the vaccine components No orders of the defined types were placed in this encounter.   Caryl Pina, MD [facility] 02/06/2022, 8:32 AM

## 2022-02-06 NOTE — Addendum Note (Signed)
Addended by: Dorene Sorrow on: 02/06/2022 11:31 AM   Modules accepted: Orders

## 2022-03-01 ENCOUNTER — Encounter: Payer: Self-pay | Admitting: Nurse Practitioner

## 2022-03-01 ENCOUNTER — Ambulatory Visit (INDEPENDENT_AMBULATORY_CARE_PROVIDER_SITE_OTHER): Payer: Medicare Other | Admitting: Nurse Practitioner

## 2022-03-01 VITALS — BP 125/78 | HR 84 | Temp 98.6°F | Ht 70.0 in | Wt 207.0 lb

## 2022-03-01 DIAGNOSIS — J069 Acute upper respiratory infection, unspecified: Secondary | ICD-10-CM

## 2022-03-01 MED ORDER — FLUTICASONE PROPIONATE 50 MCG/ACT NA SUSP: 2.0000 | Freq: Every day | NASAL | 6 refills | Status: DC

## 2022-03-01 MED ORDER — GUAIFENESIN ER 600 MG PO TB12: 600.0000 mg | ORAL_TABLET | Freq: Two times a day (BID) | ORAL | 0 refills | Status: DC

## 2022-03-01 NOTE — Progress Notes (Signed)
Acute Office Visit  Subjective:     Patient ID: Isaiah Cook, male    DOB: 02-27-1956, 65 y.o.   MRN: [id number]  Chief Complaint  Patient presents with   Cough    Few days    Nasal Congestion    Cough This is a new problem. The current episode started in the past 7 days. The problem has been gradually improving. The problem occurs constantly. The cough is Non-productive. Pertinent negatives include no chest pain, chills, ear congestion, ear pain, headaches, heartburn, nasal congestion, postnasal drip or rash. Nothing aggravates the symptoms. He has tried OTC cough suppressant for the symptoms. The treatment provided significant relief.     Review of Systems  Constitutional:  Negative for chills.  HENT:  Negative for ear pain and postnasal drip.   Respiratory:  Positive for cough.   Cardiovascular:  Negative for chest pain.  Gastrointestinal:  Negative for heartburn.  Genitourinary: Negative.   Skin: Negative.  Negative for rash.  Neurological:  Negative for headaches.  All other systems reviewed and are negative.       Objective:    BP 125/78   Pulse 84   Temp 98.6 F (37 C)   Ht 5\' 10"  (1.778 m)   Wt 207 lb (93.9 kg)   SpO2 96%   BMI 29.70 kg/m  BP Readings from Last 3 Encounters:  03/01/22 125/78  02/06/22 (!) 140/80  08/09/21 132/72   Wt Readings from Last 3 Encounters:  03/01/22 207 lb (93.9 kg)  02/06/22 211 lb (95.7 kg)  09/24/21 210 lb (95.3 kg)      Physical Exam Vitals and nursing note reviewed.  Constitutional:      Appearance: Normal appearance.  HENT:     Right Ear: External ear normal.     Left Ear: External ear normal.     Nose: Nose normal. No congestion.     Mouth/Throat:     Mouth: Mucous membranes are moist.     Pharynx: Oropharynx is clear.  Eyes:     Conjunctiva/sclera: Conjunctivae normal.  Cardiovascular:     Rate and Rhythm: Normal rate and regular rhythm.     Pulses: Normal pulses.     Heart sounds: Normal heart  sounds.  Pulmonary:     Effort: Pulmonary effort is normal.     Breath sounds: Normal breath sounds.  Skin:    Findings: No erythema or rash.  Neurological:     General: No focal deficit present.     Mental Status: He is alert and oriented to person, place, and time.  Psychiatric:        Behavior: Behavior normal.     No results found for any visits on 03/01/22.      Assessment & Plan:  Patient presents with upper viral respiratory symptoms for the past 2 to 3 days.  Patient denies fever, body aches, flu or COVID-like symptoms.  Patient reports the symptoms are improving.  I advised patient to Take meds as prescribed - Use a cool mist humidifier  -Use saline nose sprays frequently -Force fluids -For fever or aches or pains- take Tylenol or ibuprofen. -If symptoms do not improve, she may need to be COVID tested to rule this out Follow up with worsening unresolved symptoms  Problem List Items Addressed This Visit   None Visit Diagnoses     Viral URI    -  Primary   Relevant Medications   fluticasone (FLONASE) 50 MCG/ACT nasal spray  Meds ordered this encounter  Medications   fluticasone (FLONASE) 50 MCG/ACT nasal spray    Sig: Place 2 sprays into both nostrils daily.    Dispense:  16 g    Refill:  6    Order Specific Question:   Supervising Provider    Answer:   Mechele Claude [[id number]]   guaiFENesin (MUCINEX) 600 MG 12 hr tablet    Sig: Take 1 tablet (600 mg total) by mouth 2 (two) times daily.    Dispense:  30 tablet    Refill:  0    Order Specific Question:   Supervising Provider    Answer:   Mechele Claude [phone]    Return if symptoms worsen or fail to improve.  Daryll Drown, NP

## 2022-03-01 NOTE — Patient Instructions (Signed)
Viral Respiratory Infection A respiratory infection is an illness that affects part of the respiratory system, such as the lungs, nose, or throat. A respiratory infection that is caused by a virus is called a viral respiratory infection. Common types of viral respiratory infections include: A cold. The flu (influenza). A respiratory syncytial virus (RSV) infection. What are the causes? This condition is caused by a virus. The virus may spread through contact with droplets or direct contact with infected people or their mucus or secretions. The virus may spread from person to person (is contagious). What are the signs or symptoms? Symptoms of this condition include: A stuffy or runny nose. A sore throat or cough. Shortness of breath or difficulty breathing. Yellow or green mucus (sputum). Other symptoms may include: A fever. Sweating or chills. Fatigue. Achy muscles. A headache. How is this diagnosed? This condition may be diagnosed based on: Your symptoms. A physical exam. Testing of secretions from the nose or throat. Chest X-ray. How is this treated? This condition may be treated with medicines, such as: Antiviral medicine. This may shorten the length of time a person has symptoms. Expectorants. These make it easier to cough up mucus. Decongestant nasal sprays. Acetaminophen or NSAIDs, such as ibuprofen, to relieve fever and pain. Antibiotic medicines are not prescribed for viral infections.This is because antibiotics are designed to kill bacteria. They do not kill viruses. Follow these instructions at home: Managing pain and congestion Take over-the-counter and prescription medicines only as told by your health care provider. If you have a sore throat, gargle with a mixture of salt and water 3-4 times a day or as needed. To make salt water, completely dissolve -1 tsp (3-6 g) of salt in 1 cup (237 mL) of warm water. Use nose drops made from salt water to ease congestion and  soften raw skin around your nose. Take 2 tsp (10 mL) of honey at bedtime to lessen coughing at night. Do not give honey to children who are younger than 1 year. Drink enough fluid to keep your urine pale yellow. This helps prevent dehydration and helps loosen up mucus. General instructions  Rest as much as possible. Do not drink alcohol. Do not use any products that contain nicotine or tobacco. These products include cigarettes, chewing tobacco, and vaping devices, such as e-cigarettes. If you need help quitting, ask your health care provider. Keep all follow-up visits. This is important. How is this prevented?     Get an annual flu shot. You may get the flu shot in late summer, fall, or winter. Ask your health care provider when you should get your flu shot. Avoid spreading your infection to other people. If you are sick: Wash your hands with soap and water often, especially after you cough or sneeze. Wash for at least 20 seconds. If soap and water are not available, use alcohol-based hand sanitizer. Cover your mouth when you cough. Cover your nose and mouth when you sneeze. Do not share cups or eating utensils. Clean commonly used objects often. Clean commonly touched surfaces. Stay home from work or school as told by your health care provider. Avoid contact with people who are sick during cold and flu season. This is generally fall and winter. Contact a health care provider if: Your symptoms last for 10 days or longer. Your symptoms get worse over time. You have severe sinus pain in your face or forehead. The glands in your jaw or neck become very swollen. You have shortness of breath. Get   help right away if you: Feel pain or pressure in your chest. Have trouble breathing. Faint or feel like you will faint. Have severe and persistent vomiting. Feel confused or disoriented. These symptoms may represent a serious problem that is an emergency. Do not wait to see if the symptoms will  go away. Get medical help right away. Call your local emergency services (911 in the [place] Do not drive yourself to the hospital. Summary A respiratory infection is an illness that affects part of the respiratory system, such as the lungs, nose, or throat. A respiratory infection that is caused by a virus is called a viral respiratory infection. Common types of viral respiratory infections include a cold, influenza, and respiratory syncytial virus (RSV) infection. Symptoms of this condition include a stuffy or runny nose, cough, fatigue, achy muscles, sore throat, and fevers or chills. Antibiotic medicines are not prescribed for viral infections. This is because antibiotics are designed to kill bacteria. They are not effective against viruses. This information is not intended to replace advice given to you by your health care provider. Make sure you discuss any questions you have with your health care provider. Document Revised: 06/08/2020 Document Reviewed: 06/08/2020 Elsevier Patient Education  2023 Elsevier Inc.  

## 2022-05-29 ENCOUNTER — Ambulatory Visit (INDEPENDENT_AMBULATORY_CARE_PROVIDER_SITE_OTHER): Payer: Medicare Other | Admitting: Family Medicine

## 2022-05-29 ENCOUNTER — Encounter: Payer: Self-pay | Admitting: Family Medicine

## 2022-05-29 VITALS — BP 149/80 | HR 100 | Temp 96.9°F | Ht 70.0 in | Wt 207.8 lb

## 2022-05-29 DIAGNOSIS — J069 Acute upper respiratory infection, unspecified: Secondary | ICD-10-CM | POA: Diagnosis not present

## 2022-05-29 DIAGNOSIS — H66001 Acute suppurative otitis media without spontaneous rupture of ear drum, right ear: Secondary | ICD-10-CM

## 2022-05-29 MED ORDER — FLUTICASONE PROPIONATE 50 MCG/ACT NA SUSP: 2.0000 | Freq: Every day | NASAL | 6 refills | Status: DC

## 2022-05-29 MED ORDER — CEFDINIR 300 MG PO CAPS: 300.0000 mg | ORAL_CAPSULE | Freq: Two times a day (BID) | ORAL | 0 refills | Status: DC

## 2022-05-29 NOTE — Progress Notes (Signed)
Subjective:  Patient ID: Isaiah Cook, male    DOB: 1955-12-10, 67 y.o.   MRN: [id number]  Patient Care Team: Dettinger, Fransisca Kaufmann, MD as PCP - General (Family Medicine) Ronald Lobo, MD as Consulting Physician (Gastroenterology) Okey Regal, OD (Optometry)   Chief Complaint:  Cough, Nasal Congestion, and Headache (X 1 week )   HPI: Isaiah Cook is a 67 y.o. male presenting on 05/29/2022 for Cough, Nasal Congestion, and Headache (X 1 week )   Cough This is a new problem. The current episode started in the past 7 days. The problem has been gradually worsening. The problem occurs constantly. The cough is Productive of brown sputum and productive of purulent sputum. Associated symptoms include chills, ear congestion, ear pain, headaches, nasal congestion, rhinorrhea and a sore throat. Pertinent negatives include no chest pain, eye redness, fever, heartburn, hemoptysis, myalgias, postnasal drip, rash, shortness of breath, sweats, weight loss or wheezing. Nothing aggravates the symptoms. He has tried OTC cough suppressant for the symptoms. The treatment provided no relief.  Headache  This is a new problem. The current episode started in the past 7 days. The problem has been waxing and waning. The pain is located in the Frontal region. The quality of the pain is described as aching and dull. The pain is mild. Associated symptoms include coughing, ear pain, rhinorrhea, sinus pressure and a sore throat. Pertinent negatives include no abdominal pain, abnormal behavior, anorexia, back pain, blurred vision, dizziness, drainage, eye pain, eye redness, eye watering, facial sweating, fever, hearing loss, insomnia, loss of balance, muscle aches, nausea, neck pain, numbness, phonophobia, photophobia, scalp tenderness, seizures, swollen glands, tingling, tinnitus, visual change, vomiting, weakness or weight loss. Exacerbated by: bending over. He has tried acetaminophen for the symptoms. The  treatment provided no relief.       Relevant past medical, surgical, family, and social history reviewed and updated as indicated.  Allergies and medications reviewed and updated. Data reviewed: Chart in Epic.   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    Social History   Socioeconomic History   Marital status: Divorced    Spouse name: Not on file   Number of children: 1   Years of education: Not on file   Highest education level: Not on file  Occupational History   Occupation: retired  Tobacco Use   Smoking status: Never   Smokeless tobacco: Never  Vaping Use   Vaping Use: Never used  Substance and Sexual Activity   Alcohol use: No   Drug use: No   Sexual activity: Not Currently    Comment: divorced  Other Topics Concern   Not on file  Social History Narrative   Lives alone - talks to daughter and sister every day   Daughter lives in [place] - he flies out to see her every few months   Social Determinants of Health   Financial Resource Strain: [place]  (09/24/2021)   Overall Financial Resource Strain (CARDIA)    Difficulty of Paying Living Expenses: Not hard at all  Food Insecurity: No Food Insecurity (09/24/2021)   Hunger Vital Sign    Worried About Running Out of Food in the Last Year: Never true    [place] in the Last Year: Never true  Transportation Needs: No Transportation Needs (  09/24/2021)   PRAPARE - Hydrologist (Medical): No    Lack of Transportation (Non-Medical): No  Physical Activity: Sufficiently Active (09/24/2021)   Exercise Vital Sign    Days of Exercise per Week: 3 days    Minutes of Exercise per Session: 60 min  Stress: No Stress Concern Present (09/24/2021)   [place]    Feeling of Stress : Not at  all  Social Connections: Moderately Integrated (09/24/2021)   Social Connection and Isolation Panel [NHANES]    Frequency of Communication with Friends and Family: More than three times a week    Frequency of Social Gatherings with Friends and Family: More than three times a week    Attends Religious Services: More than 4 times per year    Active Member of [employer] or Organizations: Yes    Attends Music therapist: More than 4 times per year    Marital Status: Divorced  Intimate Partner Violence: Not At Risk (09/24/2021)   Humiliation, Afraid, Rape, and Kick questionnaire    Fear of Current or Ex-Partner: No    Emotionally Abused: No    Physically Abused: No    Sexually Abused: No    Outpatient Encounter Medications as of 05/29/2022  Medication Sig   Ascorbic Acid (VITAMIN C WITH ROSE HIPS) 500 MG tablet Take 500 mg by mouth daily.   cefdinir (OMNICEF) 300 MG capsule Take 1 capsule (300 mg total) by mouth 2 (two) times daily. 1 po BID   DULoxetine (CYMBALTA) 60 MG capsule Take 1 capsule (60 mg total) by mouth 2 (two) times daily.   guaiFENesin (MUCINEX) 600 MG 12 hr tablet Take 1 tablet (600 mg total) by mouth 2 (two) times daily.   ibuprofen (ADVIL,MOTRIN) 200 MG tablet Take 200-400 mg by mouth every 6 (six) hours as needed. Pain   neomycin-polymyxin-hydrocortisone (CORTISPORIN) OTIC solution Place 3 drops into the left ear 4 (four) times daily.   pravastatin (PRAVACHOL) 40 MG tablet Take one tablet daily   telmisartan (MICARDIS) 40 MG tablet Take one tablet daily   Zinc 50 MG CAPS Take by mouth.   [DISCONTINUED] fluticasone (FLONASE) 50 MCG/ACT nasal spray Place 2 sprays into both nostrils daily.   fluticasone (FLONASE) 50 MCG/ACT nasal spray Place 2 sprays into both nostrils daily.   No facility-administered encounter medications on file as of 05/29/2022.    Allergies  Allergen Reactions   Penicillins Anaphylaxis and Rash    Review of Systems  Constitutional:   Positive for chills. Negative for activity change, appetite change, diaphoresis, fatigue, fever, unexpected weight change and weight loss.  HENT:  Positive for congestion, ear pain, rhinorrhea, sinus pressure, sinus pain and sore throat. Negative for dental problem, drooling, ear discharge, hearing loss, mouth sores, nosebleeds, postnasal drip, sneezing, tinnitus, trouble swallowing and voice change.   Eyes:  Negative for blurred vision, photophobia, pain, redness and visual disturbance.  Respiratory:  Positive for cough. Negative for hemoptysis, shortness of breath and wheezing.   Cardiovascular:  Negative for chest pain, palpitations and leg swelling.  Gastrointestinal:  Negative for abdominal pain, anorexia, heartburn, nausea and vomiting.  Genitourinary:  Negative for decreased urine volume and difficulty urinating.  Musculoskeletal:  Negative for back pain, myalgias and neck pain.  Skin:  Negative for rash.  Neurological:  Positive for headaches. Negative for dizziness, tingling, tremors, seizures, syncope, facial asymmetry, speech difficulty, weakness, light-headedness, numbness and loss of balance.  Psychiatric/Behavioral:  Negative for confusion. The patient does not have insomnia.   All other systems reviewed and are negative.       Objective:  BP (!) 149/80   Pulse 100   Temp (!) 96.9 F (36.1 C) (Temporal)   Ht '5\' 10"'$  (1.778 m)   Wt 207 lb 12.8 oz (94.3 kg)   SpO2 94%   BMI 29.82 kg/m    Wt Readings from Last 3 Encounters:  05/29/22 207 lb 12.8 oz (94.3 kg)  03/01/22 207 lb (93.9 kg)  02/06/22 211 lb (95.7 kg)    Physical Exam Vitals and nursing note reviewed.  Constitutional:      General: He is not in acute distress.    Appearance: Normal appearance. He is well-developed, well-groomed and overweight. He is not ill-appearing, toxic-appearing or diaphoretic.  HENT:     Head: Normocephalic and atraumatic.     Jaw: There is normal jaw occlusion.     Right Ear:  Hearing, ear canal and external ear normal. Tympanic membrane is erythematous and bulging.     Left Ear: Hearing, ear canal and external ear normal. Tympanic membrane is erythematous.     Nose: Congestion present.     Right Turbinates: Swollen.     Left Turbinates: Swollen.     Mouth/Throat:     Lips: Pink.     Mouth: Mucous membranes are moist.     Pharynx: Oropharynx is clear. Uvula midline. Posterior oropharyngeal erythema present. No pharyngeal swelling, oropharyngeal exudate or uvula swelling.     Tonsils: No tonsillar exudate or tonsillar abscesses.  Eyes:     General: Lids are normal.     Extraocular Movements: Extraocular movements intact.     Conjunctiva/sclera: Conjunctivae normal.     Pupils: Pupils are equal, round, and reactive to light.  Neck:     Thyroid: No thyroid mass, thyromegaly or thyroid tenderness.     Vascular: No carotid bruit or JVD.     Trachea: Trachea and phonation normal.  Cardiovascular:     Rate and Rhythm: Normal rate and regular rhythm.     Chest Wall: PMI is not displaced.     Pulses: Normal pulses.     Heart sounds: Normal heart sounds. No murmur heard.    No friction rub. No gallop.  Pulmonary:     Effort: Pulmonary effort is normal. No respiratory distress.     Breath sounds: Normal breath sounds. No wheezing or rhonchi.  Abdominal:     General: Bowel sounds are normal. There is no distension or abdominal bruit.     Palpations: Abdomen is soft. There is no hepatomegaly or splenomegaly.     Tenderness: There is no abdominal tenderness. There is no right CVA tenderness or left CVA tenderness.     Hernia: No hernia is present.  Musculoskeletal:        General: Normal range of motion.     Cervical back: Normal range of motion and neck supple.     Right lower leg: No edema.     Left lower leg: No edema.  Lymphadenopathy:     Cervical: No cervical adenopathy.  Skin:    General: Skin is warm and dry.     Capillary Refill: Capillary refill takes  less than 2 seconds.     Coloration: Skin is not cyanotic, jaundiced or pale.     Findings: No rash.  Neurological:     General: No focal deficit present.     Mental Status: He is alert  and oriented to person, place, and time.     Sensory: Sensation is intact.     Motor: Motor function is intact.     Coordination: Coordination is intact.     Gait: Gait is intact.     Deep Tendon Reflexes: Reflexes are normal and symmetric.  Psychiatric:        Attention and Perception: Attention and perception normal.        Mood and Affect: Mood and affect normal.        Speech: Speech normal.        Behavior: Behavior normal. Behavior is cooperative.        Thought Content: Thought content normal.        Cognition and Memory: Cognition and memory normal.        Judgment: Judgment normal.     Results for orders placed or performed in visit on 01/29/22  PSA, total and free  Result Value Ref Range   Prostate Specific Ag, Serum 0.8 0.0 - 4.0 ng/mL   PSA, Free 0.13 N/A ng/mL   PSA, Free Pct 16.3 %  CBC with Differential/Platelet  Result Value Ref Range   WBC 6.0 3.4 - 10.8 x10E3/uL   RBC 5.31 4.14 - 5.80 x10E6/uL   Hemoglobin 15.7 13.0 - 17.7 g/dL   Hematocrit 45.6 37.5 - 51.0 %   MCV 86 79 - 97 fL   MCH 29.6 26.6 - 33.0 pg   MCHC 34.4 31.5 - 35.7 g/dL   RDW 12.3 11.6 - 15.4 %   Platelets 192 150 - 450 x10E3/uL   Neutrophils 51 Not Estab. %   Lymphs 35 Not Estab. %   Monocytes 9 Not Estab. %   Eos 4 Not Estab. %   Basos 1 Not Estab. %   Neutrophils Absolute 3.0 1.4 - 7.0 x10E3/uL   Lymphocytes Absolute 2.1 0.7 - 3.1 x10E3/uL   Monocytes Absolute 0.6 0.1 - 0.9 x10E3/uL   EOS (ABSOLUTE) 0.3 0.0 - 0.4 x10E3/uL   Basophils Absolute 0.1 0.0 - 0.2 x10E3/uL   Immature Granulocytes 0 Not Estab. %   Immature Grans (Abs) 0.0 0.0 - 0.1 x10E3/uL  CMP14+EGFR  Result Value Ref Range   Glucose 108 (H) 70 - 99 mg/dL   BUN 17 8 - 27 mg/dL   Creatinine, Ser 0.97 0.76 - 1.27 mg/dL   eGFR 85 >59  mL/min/1.73   BUN/Creatinine Ratio 18 10 - 24   Sodium 141 134 - 144 mmol/L   Potassium 4.2 3.5 - 5.2 mmol/L   Chloride 101 96 - 106 mmol/L   CO2 24 20 - 29 mmol/L   Calcium 9.6 8.6 - 10.2 mg/dL   Total Protein 6.7 6.0 - 8.5 g/dL   Albumin 4.7 3.9 - 4.9 g/dL   Globulin, Total 2.0 1.5 - 4.5 g/dL   Albumin/Globulin Ratio 2.4 (H) 1.2 - 2.2   Bilirubin Total 0.6 0.0 - 1.2 mg/dL   Alkaline Phosphatase 79 44 - 121 IU/L   AST 26 0 - 40 IU/L   ALT 28 0 - 44 IU/L  Lipid panel  Result Value Ref Range   Cholesterol, Total 152 100 - 199 mg/dL   Triglycerides 198 (H) 0 - 149 mg/dL   HDL 44 >39 mg/dL   VLDL Cholesterol Cal 33 5 - 40 mg/dL   LDL Chol Calc (NIH) 75 0 - 99 mg/dL   Chol/HDL Ratio 3.5 0.0 - 5.0 ratio  VITAMIN D 25 Hydroxy (Vit-D Deficiency, Fractures)  Result Value Ref  Range   Vit D, 25-Hydroxy 30.0 30.0 - 100.0 ng/mL       Pertinent labs & imaging results that were available during my care of the patient were reviewed by me and considered in my medical decision making.  Assessment & Plan:  Jacobb was seen today for cough, nasal congestion and headache.  Diagnoses and all orders for this visit:  URI with cough and congestion Continue Mucinex with plenty of water at home. Will start below as pt does have erythema and bulging of right TM. Pt aware to take medications as prescribed. Report new, worsening, or persistent symptoms.  -     cefdinir (OMNICEF) 300 MG capsule; Take 1 capsule (300 mg total) by mouth 2 (two) times daily. 1 po BID -     fluticasone (FLONASE) 50 MCG/ACT nasal spray; Place 2 sprays into both nostrils daily.  Non-recurrent acute suppurative otitis media of right ear without spontaneous rupture of tympanic membrane -     cefdinir (OMNICEF) 300 MG capsule; Take 1 capsule (300 mg total) by mouth 2 (two) times daily. 1 po BID     Continue all other maintenance medications.  Follow up plan: Return if symptoms worsen or fail to improve.   Continue healthy  lifestyle choices, including diet (rich in fruits, vegetables, and lean proteins, and low in salt and simple carbohydrates) and exercise (at least 30 minutes of moderate physical activity daily).  Educational handout given for otitis media  The above assessment and management plan was discussed with the patient. The patient verbalized understanding of and has agreed to the management plan. Patient is aware to call the clinic if they develop any new symptoms or if symptoms persist or worsen. Patient is aware when to return to the clinic for a follow-up visit. Patient educated on when it is appropriate to go to the emergency department.   Monia Pouch, FNP-C [facility] [phone]

## 2022-08-26 ENCOUNTER — Encounter: Payer: Self-pay | Admitting: *Deleted

## 2022-09-26 ENCOUNTER — Ambulatory Visit: Payer: Medicare Other

## 2022-09-26 VITALS — Ht 70.0 in | Wt 200.0 lb

## 2022-09-26 DIAGNOSIS — Z0000 Encounter for general adult medical examination without abnormal findings: Secondary | ICD-10-CM | POA: Diagnosis not present

## 2022-09-26 NOTE — Progress Notes (Signed)
Subjective:   Isaiah Cook is a 67 y.o. male who presents for Medicare Annual/Subsequent preventive examination.  Visit Complete: Virtual  I connected with  Isaiah Cook on 09/26/22 by a audio enabled telemedicine application and verified that I am speaking with the correct person using two identifiers.  Patient Location: Home  Provider Location: Home Office  I discussed the limitations of evaluation and management by telemedicine. The patient expressed understanding and agreed to proceed.  Patient Medicare AWV questionnaire was completed by the patient on 09/26/2022; I have confirmed that all information answered by patient is correct and no changes since this date.  Review of Systems     Cardiac Risk Factors include: advanced age (>46men, >64 women);male gender;hypertension;dyslipidemia     Objective:    Today's Vitals   09/26/22 1220  Weight: 200 lb (90.7 kg)  Height: 5\' 10"  (1.778 m)   Body mass index is 28.7 kg/m.     09/26/2022   12:24 PM 09/24/2021    9:59 AM 09/21/2020    9:13 AM  Advanced Directives  Does Patient Have a Medical Advance Directive? Yes Yes No  Type of Estate agent of [place];Living will Healthcare Power of [place];Living will   Copy of Healthcare Power of Attorney in Chart? No - copy requested No - copy requested   Would patient like information on creating a medical advance directive?   No - Patient declined    Current Medications (verified) Outpatient Encounter Medications as of 09/26/2022  Medication Sig   Ascorbic Acid (VITAMIN C WITH ROSE HIPS) 500 MG tablet Take 500 mg by mouth daily.   DULoxetine (CYMBALTA) 60 MG capsule Take 1 capsule (60 mg total) by mouth 2 (two) times daily.   fluticasone (FLONASE) 50 MCG/ACT nasal spray Place 2 sprays into both nostrils daily.   guaiFENesin (MUCINEX) 600 MG 12 hr tablet Take 1 tablet (600 mg total) by mouth 2 (two) times daily.   ibuprofen (ADVIL,MOTRIN) 200 MG tablet  Take 200-400 mg by mouth every 6 (six) hours as needed. Pain   neomycin-polymyxin-hydrocortisone (CORTISPORIN) OTIC solution Place 3 drops into the left ear 4 (four) times daily.   pravastatin (PRAVACHOL) 40 MG tablet Take one tablet daily   telmisartan (MICARDIS) 40 MG tablet Take one tablet daily   Zinc 50 MG CAPS Take by mouth.   cefdinir (OMNICEF) 300 MG capsule Take 1 capsule (300 mg total) by mouth 2 (two) times daily. 1 po BID (Patient not taking: Reported on 09/26/2022)   No facility-administered encounter medications on file as of 09/26/2022.    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   Family History  Problem Relation Age of Onset   Cancer Mother        colon   Alzheimer's disease Mother    Heart attack Father    Social History   Socioeconomic History   Marital status: Divorced    Spouse name: Not on file   Number of children: 1   Years of education: Not on file   Highest education level: Not on file  Occupational History   Occupation: retired  Tobacco Use   Smoking status: Never   Smokeless tobacco: Never  Vaping Use   Vaping status: Never Used  Substance and Sexual Activity   Alcohol  use: No   Drug use: No   Sexual activity: Not Currently    Comment: divorced  Other Topics Concern   Not on file  Social History Narrative   Lives alone - talks to daughter and sister every day   Daughter lives in [place], [place] - he flies out to see her every few months   Social Determinants of Health   Financial Resource Strain: Low Risk  (09/26/2022)   Overall Financial Resource Strain (CARDIA)    Difficulty of Paying Living Expenses: Not hard at all  Food Insecurity: No Food Insecurity (09/26/2022)   Hunger Vital Sign    Worried About Running Out of Food in the Last Year: Never true    Ran  Out of Food in the Last Year: Never true  Transportation Needs: No Transportation Needs (09/26/2022)   PRAPARE - Administrator, Civil Service (Medical): No    Lack of Transportation (Non-Medical): No  Physical Activity: Insufficiently Active (09/26/2022)   Exercise Vital Sign    Days of Exercise per Week: 3 days    Minutes of Exercise per Session: 30 min  Stress: No Stress Concern Present (09/26/2022)   [employer] of Occupational Health - Occupational Stress Questionnaire    Feeling of Stress : Not at all  Social Connections: Moderately Integrated (09/26/2022)   Social Connection and Isolation Panel [NHANES]    Frequency of Communication with Friends and Family: More than three times a week    Frequency of Social Gatherings with Friends and Family: More than three times a week    Attends Religious Services: More than 4 times per year    Active Member of [employer] or Organizations: Yes    Attends Engineer, structural: More than 4 times per year    Marital Status: Divorced    Tobacco Counseling Counseling given: Not Answered   Clinical Intake:  Pre-visit preparation completed: Yes  Pain : No/denies pain     Nutritional Risks: None Diabetes: No  How often do you need to have someone help you when you read instructions, pamphlets, or other written materials from your doctor or pharmacy?: 1 - Never  Interpreter Needed?: No  Information entered by :: Renie Ora, LPN   Activities of Daily Living    09/26/2022   12:24 PM  In your present state of health, do you have any difficulty performing the following activities:  Hearing? 0  Vision? 0  Difficulty concentrating or making decisions? 0  Walking or climbing stairs? 0  Dressing or bathing? 0  Doing errands, shopping? 0  Preparing Food and eating ? N  Using the Toilet? N  In the past six months, have you accidently leaked urine? N  Do you have problems with loss of bowel control? N  Managing your  Medications? N  Managing your Finances? N  Housekeeping or managing your Housekeeping? N    Patient Care Team: Dettinger, Elige Radon, MD as PCP - General (Family Medicine) Bernette Redbird, MD as Consulting Physician (Gastroenterology) Smitty Cords, OD (Optometry)  Indicate any recent Medical Services you may have received from other than [facility] providers in the past year (date may be approximate).     Assessment:   This is a routine wellness examination for Isaiah Cook.  Hearing/Vision screen Vision Screening - Comments:: Wears rx glasses - up to date with routine eye exams with  Dr.johnson   Dietary issues and exercise activities discussed:     Goals Addressed  This Visit's Progress    Exercise 3x per week (30 min per time)   On track      Depression Screen    09/26/2022   12:23 PM 03/01/2022   10:02 AM 02/06/2022    8:19 AM 09/24/2021    9:57 AM 08/09/2021    8:44 AM 01/11/2021    9:25 AM 09/21/2020    9:11 AM  PHQ 2/9 Scores  PHQ - 2 Score 0 0 0 0 0 0 0  PHQ- 9 Score  0 0 0 0      Fall Risk    09/26/2022   12:21 PM 03/01/2022   10:02 AM 02/06/2022    8:19 AM 09/24/2021    9:55 AM 08/09/2021    8:44 AM  Fall Risk   Falls in the past year? 0 0 0 0 0  Number falls in past yr: 0 0  0   Injury with Fall? 0 0  0   Risk for fall due to : No Fall Risks No Fall Risks  No Fall Risks   Follow up Falls prevention discussed Education provided  Falls prevention discussed     MEDICARE RISK AT HOME:  Medicare Risk at Home - 09/26/22 1221     Any stairs in or around the home? No    If so, are there any without handrails? No    Home free of loose throw rugs in walkways, pet beds, electrical cords, etc? Yes    Adequate lighting in your home to reduce risk of falls? Yes    Life alert? No    Use of a cane, walker or w/c? No    Grab bars in the bathroom? Yes    Shower chair or bench in shower? Yes    Elevated toilet seat or a handicapped toilet? Yes              TIMED UP AND GO:  Was the test performed?  No    Cognitive Function:        09/26/2022   12:24 PM 09/24/2021   10:01 AM 09/21/2020    9:17 AM  6CIT Screen  What Year? 0 points 0 points 0 points  What month? 0 points 0 points 0 points  What time? 0 points 0 points 0 points  Count back from 20 0 points 0 points 0 points  Months in reverse 0 points 2 points 0 points  Repeat phrase 0 points 10 points 0 points  Total Score 0 points 12 points 0 points    Immunizations Immunization History  Administered Date(s) Administered   Fluad Quad(high Dose 65+) 12/23/2018, 01/12/2020, 02/06/2022   Influenza,inj,Quad PF,6+ Mos 01/11/2021   Moderna Sars-Covid-2 Vaccination 05/13/2019, 06/11/2019   Pneumococcal Conjugate-13 08/18/2018   Pneumococcal Polysaccharide-23 01/12/2020   Tdap 11/25/2008, 12/23/2018   Zoster Recombinant(Shingrix) 08/09/2021, 02/06/2022    TDAP status: Up to date  Flu Vaccine status: Up to date  Pneumococcal vaccine status: Up to date  Covid-19 vaccine status: Completed vaccines  Qualifies for Shingles Vaccine? Yes   Zostavax completed Yes   Shingrix Completed?: Yes  Screening Tests Health Maintenance  Topic Date Due   Hepatitis C Screening  Never done   COVID-19 Vaccine (3 - 2023-24 season) 11/16/2021   INFLUENZA VACCINE  10/17/2022   Medicare Annual Wellness (AWV)  09/26/2023   Colonoscopy  11/15/2025   DTaP/Tdap/Td (3 - Td or Tdap) 12/22/2028   Pneumonia Vaccine 68+ Years old  Completed   Zoster Vaccines- Shingrix  Completed   HPV VACCINES  Aged Out    Health Maintenance  Health Maintenance Due  Topic Date Due   Hepatitis C Screening  Never done   COVID-19 Vaccine (3 - 2023-24 season) 11/16/2021    Colorectal cancer screening: Type of screening: Colonoscopy. Completed 11/15/2020. Repeat every 5 years  Lung Cancer Screening: (Low Dose CT Chest recommended if Age 83-80 years, 20 pack-year currently smoking OR have quit w/in 15years.) does  not qualify.   Lung Cancer Screening Referral: n/a  Additional Screening:  Hepatitis C Screening: does not qualify;   Vision Screening: Recommended annual ophthalmology exams for early detection of glaucoma and other disorders of the eye. Is the patient up to date with their annual eye exam?  Yes  Who is the provider or what is the name of the office in which the patient attends annual eye exams? Dr.johnson  If pt is not established with a provider, would they like to be referred to a provider to establish care? No .   Dental Screening: Recommended annual dental exams for proper oral hygiene   Community Resource Referral / Chronic Care Management: CRR required this visit?  No   CCM required this visit?  No     Plan:     I have personally reviewed and noted the following in the patient's chart:   Medical and social history Use of alcohol, tobacco or illicit drugs  Current medications and supplements including opioid prescriptions. Patient is not currently taking opioid prescriptions. Functional ability and status Nutritional status Physical activity Advanced directives List of other physicians Hospitalizations, surgeries, and ER visits in previous 12 months Vitals Screenings to include cognitive, depression, and falls Referrals and appointments  In addition, I have reviewed and discussed with patient certain preventive protocols, quality metrics, and best practice recommendations. A written personalized care plan for preventive services as well as general preventive health recommendations were provided to patient.     Lorrene Reid, LPN   1/61/0960   After Visit Summary: (MyChart) Due to this being a telephonic visit, the after visit summary with patients personalized plan was offered to patient via MyChart   Nurse Notes: none

## 2022-09-26 NOTE — Patient Instructions (Signed)
Mr. Isaiah Cook , Thank you for taking time to come for your Medicare Wellness Visit. I appreciate your ongoing commitment to your health goals. Please review the following plan we discussed and let me know if I can assist you in the future.   These are the goals we discussed:  Goals      Exercise 3x per week (30 min per time)        This is a list of the screening recommended for you and due dates:  Health Maintenance  Topic Date Due   Hepatitis C Screening  Never done   COVID-19 Vaccine (3 - 2023-24 season) 11/16/2021   Flu Shot  10/17/2022   Medicare Annual Wellness Visit  09/26/2023   Colon Cancer Screening  11/15/2025   DTaP/Tdap/Td vaccine (3 - Td or Tdap) 12/22/2028   Pneumonia Vaccine  Completed   Zoster (Shingles) Vaccine  Completed   HPV Vaccine  Aged Out    Advanced directives: Please bring a copy of your health care power of attorney and living will to the office to be added to your chart at your convenience.   Conditions/risks identified: Aim for 30 minutes of exercise or brisk walking, 6-8 glasses of water, and 5 servings of fruits and vegetables each day.   Next appointment: Follow up in one year for your annual wellness visit.   Preventive Care 69 Years and Older, Male  Preventive care refers to lifestyle choices and visits with your health care provider that can promote health and wellness. What does preventive care include? A yearly physical exam. This is also called an annual well check. Dental exams once or twice a year. Routine eye exams. Ask your health care provider how often you should have your eyes checked. Personal lifestyle choices, including: Daily care of your teeth and gums. Regular physical activity. Eating a healthy diet. Avoiding tobacco and drug use. Limiting alcohol use. Practicing safe sex. Taking low doses of aspirin every day. Taking vitamin and mineral supplements as recommended by your health care provider. What happens during an  annual well check? The services and screenings done by your health care provider during your annual well check will depend on your age, overall health, lifestyle risk factors, and family history of disease. Counseling  Your health care provider may ask you questions about your: Alcohol use. Tobacco use. Drug use. Emotional well-being. Home and relationship well-being. Sexual activity. Eating habits. History of falls. Memory and ability to understand (cognition). Work and work Astronomer. Screening  You may have the following tests or measurements: Height, weight, and BMI. Blood pressure. Lipid and cholesterol levels. These may be checked every 5 years, or more frequently if you are over 94 years old. Skin check. Lung cancer screening. You may have this screening every year starting at age 18 if you have a 30-pack-year history of smoking and currently smoke or have quit within the past 15 years. Fecal occult blood test (FOBT) of the stool. You may have this test every year starting at age 66. Flexible sigmoidoscopy or colonoscopy. You may have a sigmoidoscopy every 5 years or a colonoscopy every 10 years starting at age 32. Prostate cancer screening. Recommendations will vary depending on your family history and other risks. Hepatitis C blood test. Hepatitis B blood test. Sexually transmitted disease (STD) testing. Diabetes screening. This is done by checking your blood sugar (glucose) after you have not eaten for a while (fasting). You may have this done every 1-3 years. Abdominal aortic aneurysm (AAA)  screening. You may need this if you are a current or former smoker. Osteoporosis. You may be screened starting at age 90 if you are at high risk. Talk with your health care provider about your test results, treatment options, and if necessary, the need for more tests. Vaccines  Your health care provider may recommend certain vaccines, such as: Influenza vaccine. This is recommended  every year. Tetanus, diphtheria, and acellular pertussis (Tdap, Td) vaccine. You may need a Td booster every 10 years. Zoster vaccine. You may need this after age 44. Pneumococcal 13-valent conjugate (PCV13) vaccine. One dose is recommended after age 61. Pneumococcal polysaccharide (PPSV23) vaccine. One dose is recommended after age 63. Talk to your health care provider about which screenings and vaccines you need and how often you need them. This information is not intended to replace advice given to you by your health care provider. Make sure you discuss any questions you have with your health care provider. Document Released: 03/31/2015 Document Revised: 11/22/2015 Document Reviewed: 01/03/2015 Elsevier Interactive Patient Education  2017 [employer].  Fall Prevention in the Home Falls can cause injuries. They can happen to people of all ages. There are many things you can do to make your home safe and to help prevent falls. What can I do on the outside of my home? Regularly fix the edges of walkways and driveways and fix any cracks. Remove anything that might make you trip as you walk through a door, such as a raised step or threshold. Trim any bushes or trees on the path to your home. Use bright outdoor lighting. Clear any walking paths of anything that might make someone trip, such as rocks or tools. Regularly check to see if handrails are loose or broken. Make sure that both sides of any steps have handrails. Any raised decks and porches should have guardrails on the edges. Have any leaves, snow, or ice cleared regularly. Use sand or salt on walking paths during winter. Clean up any spills in your garage right away. This includes oil or grease spills. What can I do in the bathroom? Use night lights. Install grab bars by the toilet and in the tub and shower. Do not use towel bars as grab bars. Use non-skid mats or decals in the tub or shower. If you need to sit down in the shower,  use a plastic, non-slip stool. Keep the floor dry. Clean up any water that spills on the floor as soon as it happens. Remove soap buildup in the tub or shower regularly. Attach bath mats securely with double-sided non-slip rug tape. Do not have throw rugs and other things on the floor that can make you trip. What can I do in the bedroom? Use night lights. Make sure that you have a light by your bed that is easy to reach. Do not use any sheets or blankets that are too big for your bed. They should not hang down onto the floor. Have a firm chair that has side arms. You can use this for support while you get dressed. Do not have throw rugs and other things on the floor that can make you trip. What can I do in the kitchen? Clean up any spills right away. Avoid walking on wet floors. Keep items that you use a lot in easy-to-reach places. If you need to reach something above you, use a strong step stool that has a grab bar. Keep electrical cords out of the way. Do not use floor polish or  wax that makes floors slippery. If you must use wax, use non-skid floor wax. Do not have throw rugs and other things on the floor that can make you trip. What can I do with my stairs? Do not leave any items on the stairs. Make sure that there are handrails on both sides of the stairs and use them. Fix handrails that are broken or loose. Make sure that handrails are as long as the stairways. Check any carpeting to make sure that it is firmly attached to the stairs. Fix any carpet that is loose or worn. Avoid having throw rugs at the top or bottom of the stairs. If you do have throw rugs, attach them to the floor with carpet tape. Make sure that you have a light switch at the top of the stairs and the bottom of the stairs. If you do not have them, ask someone to add them for you. What else can I do to help prevent falls? Wear shoes that: Do not have high heels. Have rubber bottoms. Are comfortable and fit you  well. Are closed at the toe. Do not wear sandals. If you use a stepladder: Make sure that it is fully opened. Do not climb a closed stepladder. Make sure that both sides of the stepladder are locked into place. Ask someone to hold it for you, if possible. Clearly mark and make sure that you can see: Any grab bars or handrails. First and last steps. Where the edge of each step is. Use tools that help you move around (mobility aids) if they are needed. These include: Canes. Walkers. Scooters. Crutches. Turn on the lights when you go into a dark area. Replace any light bulbs as soon as they burn out. Set up your furniture so you have a clear path. Avoid moving your furniture around. If any of your floors are uneven, fix them. If there are any pets around you, be aware of where they are. Review your medicines with your doctor. Some medicines can make you feel dizzy. This can increase your chance of falling. Ask your doctor what other things that you can do to help prevent falls. This information is not intended to replace advice given to you by your health care provider. Make sure you discuss any questions you have with your health care provider. Document Released: 12/29/2008 Document Revised: 08/10/2015 Document Reviewed: 04/08/2014 Elsevier Interactive Patient Education  2017 [employer].

## 2022-10-04 ENCOUNTER — Telehealth: Payer: Self-pay | Admitting: Family Medicine

## 2022-10-04 ENCOUNTER — Other Ambulatory Visit: Payer: Self-pay | Admitting: Family Medicine

## 2022-10-04 DIAGNOSIS — E785 Hyperlipidemia, unspecified: Secondary | ICD-10-CM

## 2022-10-04 DIAGNOSIS — I10 Essential (primary) hypertension: Secondary | ICD-10-CM

## 2022-10-04 NOTE — Telephone Encounter (Signed)
Orders placed.

## 2022-10-04 NOTE — Telephone Encounter (Signed)
Patient has appt for 7/29 and wanted to come in for labs on 7/24, appt made. Please add orders

## 2022-10-07 ENCOUNTER — Ambulatory Visit: Payer: Medicare Other | Admitting: Family Medicine

## 2022-10-09 ENCOUNTER — Other Ambulatory Visit: Payer: Medicare Other

## 2022-10-11 ENCOUNTER — Other Ambulatory Visit: Payer: Medicare Other

## 2022-10-11 DIAGNOSIS — E785 Hyperlipidemia, unspecified: Secondary | ICD-10-CM | POA: Diagnosis not present

## 2022-10-11 DIAGNOSIS — I10 Essential (primary) hypertension: Secondary | ICD-10-CM

## 2022-10-11 LAB — CBC WITH DIFFERENTIAL/PLATELET
Basophils Absolute: 0 10*3/uL (ref 0.0–0.2)
Basos: 1 %
EOS (ABSOLUTE): 0.1 10*3/uL (ref 0.0–0.4)
Eos: 2 %
Hematocrit: 44.7 % (ref 37.5–51.0)
Hemoglobin: 15.5 g/dL (ref 13.0–17.7)
Immature Grans (Abs): 0 10*3/uL (ref 0.0–0.1)
Immature Granulocytes: 0 %
Lymphocytes Absolute: 1.6 10*3/uL (ref 0.7–3.1)
Lymphs: 32 %
MCH: 29.7 pg (ref 26.6–33.0)
MCHC: 34.7 g/dL (ref 31.5–35.7)
MCV: 86 fL (ref 79–97)
Monocytes Absolute: 0.4 10*3/uL (ref 0.1–0.9)
Monocytes: 7 %
Neutrophils Absolute: 2.9 10*3/uL (ref 1.4–7.0)
Neutrophils: 58 %
Platelets: 166 10*3/uL (ref 150–450)
RBC: 5.22 x10E6/uL (ref 4.14–5.80)
RDW: 12.3 % (ref 11.6–15.4)
WBC: 5 10*3/uL (ref 3.4–10.8)

## 2022-10-11 LAB — CMP14+EGFR
ALT: 38 IU/L (ref 0–44)
AST: 26 IU/L (ref 0–40)
Albumin: 4.6 g/dL (ref 3.9–4.9)
Alkaline Phosphatase: 72 IU/L (ref 44–121)
BUN/Creatinine Ratio: 16 (ref 10–24)
BUN: 14 mg/dL (ref 8–27)
Bilirubin Total: 0.5 mg/dL (ref 0.0–1.2)
CO2: 24 mmol/L (ref 20–29)
Calcium: 9.4 mg/dL (ref 8.6–10.2)
Chloride: 104 mmol/L (ref 96–106)
Creatinine, Ser: 0.87 mg/dL (ref 0.76–1.27)
Globulin, Total: 1.9 g/dL (ref 1.5–4.5)
Glucose: 112 mg/dL — ABNORMAL HIGH (ref 70–99)
Potassium: 4.3 mmol/L (ref 3.5–5.2)
Sodium: 140 mmol/L (ref 134–144)
Total Protein: 6.5 g/dL (ref 6.0–8.5)
eGFR: 93 mL/min/{1.73_m2} (ref >59)

## 2022-10-11 LAB — LIPID PANEL
Chol/HDL Ratio: 3.7 ratio (ref 0.0–5.0)
Cholesterol, Total: 161 mg/dL (ref 100–199)
HDL: 43 mg/dL (ref >39)
LDL Chol Calc (NIH): 94 mg/dL (ref 0–99)
Triglycerides: 135 mg/dL (ref 0–149)
VLDL Cholesterol Cal: 24 mg/dL (ref 5–40)

## 2022-10-14 ENCOUNTER — Ambulatory Visit (INDEPENDENT_AMBULATORY_CARE_PROVIDER_SITE_OTHER): Payer: Medicare Other | Admitting: Family Medicine

## 2022-10-14 ENCOUNTER — Encounter: Payer: Self-pay | Admitting: Family Medicine

## 2022-10-14 VITALS — BP 120/74 | HR 90 | Ht 70.0 in | Wt 209.0 lb

## 2022-10-14 DIAGNOSIS — I10 Essential (primary) hypertension: Secondary | ICD-10-CM | POA: Diagnosis not present

## 2022-10-14 DIAGNOSIS — E785 Hyperlipidemia, unspecified: Secondary | ICD-10-CM | POA: Diagnosis not present

## 2022-10-14 DIAGNOSIS — F411 Generalized anxiety disorder: Secondary | ICD-10-CM

## 2022-10-14 DIAGNOSIS — F3342 Major depressive disorder, recurrent, in full remission: Secondary | ICD-10-CM | POA: Diagnosis not present

## 2022-10-14 MED ORDER — TELMISARTAN 40 MG PO TABS
ORAL_TABLET | ORAL | 3 refills | Status: DC
Start: 2022-10-14 — End: 2023-08-25

## 2022-10-14 MED ORDER — PRAVASTATIN SODIUM 40 MG PO TABS
ORAL_TABLET | ORAL | 1 refills | Status: DC
Start: 2022-10-14 — End: 2023-03-17

## 2022-10-14 MED ORDER — DULOXETINE HCL 60 MG PO CPEP: 60.0000 mg | ORAL_CAPSULE | Freq: Two times a day (BID) | ORAL | 3 refills | Status: DC

## 2022-10-14 NOTE — Progress Notes (Signed)
BP 120/74   Pulse 90   Ht 5\' 10"  (1.778 m)   Wt 209 lb (94.8 kg)   SpO2 96%   BMI 29.99 kg/m    Subjective:   Patient ID: Isaiah Cook, male    DOB: 05-08-1955, 67 y.o.   MRN: [id number]  HPI: Isaiah Cook is a 67 y.o. male presenting on 10/14/2022 for Medical Management of Chronic Issues, Hypertension, Hyperlipidemia, and Depression   HPI Hyperlipidemia Patient is coming in for recheck of his hyperlipidemia. The patient is currently taking pravastatin. They deny any issues with myalgias or history of liver damage from it. They deny any focal numbness or weakness or chest pain.   Hypertension Patient is currently on telmisartan, and their blood pressure today is 120/74. Patient denies any lightheadedness or dizziness. Patient denies headaches, blurred vision, chest pains, shortness of breath, or weakness. Denies any side effects from medication and is content with current medication.   Anxiety depression recheck Patient is currently taking Cymbalta for anxiety depression.  Feels like he is doing pretty good with depression.  Denies any suicidal ideations or thoughts for himself.    10/14/2022    8:59 AM 09/26/2022   12:23 PM 03/01/2022   10:02 AM 02/06/2022    8:19 AM 09/24/2021    9:57 AM  Depression screen PHQ 2/9  Decreased Interest 0 0 0 0 0  Down, Depressed, Hopeless 0 0 0 0 0  PHQ - 2 Score 0 0 0 0 0  Altered sleeping 0  0 0 0  Tired, decreased energy 0  0 0 0  Change in appetite 0  0 0 0  Feeling bad or failure about yourself  0  0 0 0  Trouble concentrating 0  0 0 0  Moving slowly or fidgety/restless 0  0 0 0  Suicidal thoughts 0  0 0 0  PHQ-9 Score 0  0 0 0  Difficult doing work/chores Not difficult at all  Not difficult at all Not difficult at all      Relevant past medical, surgical, family and social history reviewed and updated as indicated. Interim medical history since our last visit reviewed. Allergies and medications reviewed and  updated.  Review of Systems  Constitutional:  Negative for chills and fever.  Eyes:  Negative for visual disturbance.  Respiratory:  Negative for shortness of breath and wheezing.   Cardiovascular:  Negative for chest pain and leg swelling.  Skin:  Negative for rash.  Neurological:  Negative for dizziness, weakness and light-headedness.  Psychiatric/Behavioral:  Negative for dysphoric mood, self-injury, sleep disturbance and suicidal ideas. The patient is not nervous/anxious.   All other systems reviewed and are negative.   Per HPI unless specifically indicated above   Allergies as of 10/14/2022       Reactions   Penicillins Anaphylaxis, Rash        Medication List        Accurate as of October 14, 2022  9:21 AM. If you have any questions, ask your nurse or doctor.          STOP taking these medications    cefdinir 300 MG capsule Commonly known as: OMNICEF Stopped by: Elige Radon Pascale Maves   guaiFENesin 600 MG 12 hr tablet Commonly known as: Mucinex Stopped by: Elige Radon Gema Ringold       TAKE these medications    DULoxetine 60 MG capsule Commonly known as: CYMBALTA Take 1 capsule (60 mg total) by mouth 2 (two)  times daily.   fluticasone 50 MCG/ACT nasal spray Commonly known as: FLONASE Place 2 sprays into both nostrils daily.   ibuprofen 200 MG tablet Commonly known as: ADVIL Take 200-400 mg by mouth every 6 (six) hours as needed. Pain   neomycin-polymyxin-hydrocortisone OTIC solution Commonly known as: CORTISPORIN Place 3 drops into the left ear 4 (four) times daily.   pravastatin 40 MG tablet Commonly known as: PRAVACHOL Take one tablet daily   telmisartan 40 MG tablet Commonly known as: MICARDIS Take one tablet daily   vitamin C with rose hips 500 MG tablet Take 500 mg by mouth daily.   Zinc 50 MG Caps Take by mouth.         Objective:   BP 120/74   Pulse 90   Ht 5\' 10"  (1.778 m)   Wt 209 lb (94.8 kg)   SpO2 96%   BMI 29.99 kg/m    Wt Readings from Last 3 Encounters:  10/14/22 209 lb (94.8 kg)  09/26/22 200 lb (90.7 kg)  05/29/22 207 lb 12.8 oz (94.3 kg)    Physical Exam Vitals and nursing note reviewed.  Constitutional:      General: He is not in acute distress.    Appearance: He is well-developed. He is not diaphoretic.  Eyes:     General: No scleral icterus.    Conjunctiva/sclera: Conjunctivae normal.  Neck:     Thyroid: No thyromegaly.  Cardiovascular:     Rate and Rhythm: Normal rate and regular rhythm.     Heart sounds: Normal heart sounds. No murmur heard. Pulmonary:     Effort: Pulmonary effort is normal. No respiratory distress.     Breath sounds: Normal breath sounds. No wheezing.  Musculoskeletal:        General: Normal range of motion.     Cervical back: Neck supple.  Lymphadenopathy:     Cervical: No cervical adenopathy.  Skin:    General: Skin is warm and dry.     Findings: No rash.  Neurological:     Mental Status: He is alert and oriented to person, place, and time.     Coordination: Coordination normal.  Psychiatric:        Behavior: Behavior normal.     Results for orders placed or performed in visit on 10/11/22  Lipid panel  Result Value Ref Range   Cholesterol, Total 161 100 - 199 mg/dL   Triglycerides 161 0 - 149 mg/dL   HDL 43 >09 mg/dL   VLDL Cholesterol Cal 24 5 - 40 mg/dL   LDL Chol Calc (NIH) 94 0 - 99 mg/dL   Chol/HDL Ratio 3.7 0.0 - 5.0 ratio  CBC with Differential/Platelet  Result Value Ref Range   WBC 5.0 3.4 - 10.8 x10E3/uL   RBC 5.22 4.14 - 5.80 x10E6/uL   Hemoglobin 15.5 13.0 - 17.7 g/dL   Hematocrit 60.4 54.0 - 51.0 %   MCV 86 79 - 97 fL   MCH 29.7 26.6 - 33.0 pg   MCHC 34.7 31.5 - 35.7 g/dL   RDW 98.1 19.1 - 47.8 %   Platelets 166 150 - 450 x10E3/uL   Neutrophils 58 Not Estab. %   Lymphs 32 Not Estab. %   Monocytes 7 Not Estab. %   Eos 2 Not Estab. %   Basos 1 Not Estab. %   Neutrophils Absolute 2.9 1.4 - 7.0 x10E3/uL   Lymphocytes Absolute 1.6  0.7 - 3.1 x10E3/uL   Monocytes Absolute 0.4 0.1 - 0.9 x10E3/uL  EOS (ABSOLUTE) 0.1 0.0 - 0.4 x10E3/uL   Basophils Absolute 0.0 0.0 - 0.2 x10E3/uL   Immature Granulocytes 0 Not Estab. %   Immature Grans (Abs) 0.0 0.0 - 0.1 x10E3/uL  CMP14+EGFR  Result Value Ref Range   Glucose 112 (H) 70 - 99 mg/dL   BUN 14 8 - 27 mg/dL   Creatinine, Ser 1.61 0.76 - 1.27 mg/dL   eGFR 93 >09 UE/AVW/0.98   BUN/Creatinine Ratio 16 10 - 24   Sodium 140 134 - 144 mmol/L   Potassium 4.3 3.5 - 5.2 mmol/L   Chloride 104 96 - 106 mmol/L   CO2 24 20 - 29 mmol/L   Calcium 9.4 8.6 - 10.2 mg/dL   Total Protein 6.5 6.0 - 8.5 g/dL   Albumin 4.6 3.9 - 4.9 g/dL   Globulin, Total 1.9 1.5 - 4.5 g/dL   Bilirubin Total 0.5 0.0 - 1.2 mg/dL   Alkaline Phosphatase 72 44 - 121 IU/L   AST 26 0 - 40 IU/L   ALT 38 0 - 44 IU/L    Assessment & Plan:   Problem List Items Addressed This Visit       Cardiovascular and Mediastinum   Essential hypertension   Relevant Medications   pravastatin (PRAVACHOL) 40 MG tablet   telmisartan (MICARDIS) 40 MG tablet     Other   Anxiety, generalized - Primary   Relevant Medications   DULoxetine (CYMBALTA) 60 MG capsule   Dyslipidemia   Relevant Medications   pravastatin (PRAVACHOL) 40 MG tablet   Major depressive disorder   Relevant Medications   DULoxetine (CYMBALTA) 60 MG capsule  Blood work looks pretty decent except for mildly elevated blood sugar.  Had some dryness in his ears so recommended mineral oil drops or sweet oil drops  Follow up plan: Return in about 6 months (around 04/16/2023), or if symptoms worsen or fail to improve, for Hypertension and hyperlipidemia and anxiety depression.  Counseling provided for all of the vaccine components No orders of the defined types were placed in this encounter.   Arville Care, MD [facility] 10/14/2022, 9:21 AM

## 2022-12-05 DIAGNOSIS — L821 Other seborrheic keratosis: Secondary | ICD-10-CM | POA: Diagnosis not present

## 2022-12-05 DIAGNOSIS — D225 Melanocytic nevi of trunk: Secondary | ICD-10-CM | POA: Diagnosis not present

## 2022-12-05 DIAGNOSIS — X32XXXA Exposure to sunlight, initial encounter: Secondary | ICD-10-CM | POA: Diagnosis not present

## 2022-12-05 DIAGNOSIS — L820 Inflamed seborrheic keratosis: Secondary | ICD-10-CM | POA: Diagnosis not present

## 2022-12-05 DIAGNOSIS — L570 Actinic keratosis: Secondary | ICD-10-CM | POA: Diagnosis not present

## 2022-12-31 ENCOUNTER — Ambulatory Visit: Payer: Medicare Other

## 2023-01-01 ENCOUNTER — Ambulatory Visit (INDEPENDENT_AMBULATORY_CARE_PROVIDER_SITE_OTHER): Payer: Medicare Other

## 2023-01-01 DIAGNOSIS — Z23 Encounter for immunization: Secondary | ICD-10-CM

## 2023-02-03 ENCOUNTER — Ambulatory Visit: Payer: Medicare Other | Admitting: Family Medicine

## 2023-02-10 DIAGNOSIS — M542 Cervicalgia: Secondary | ICD-10-CM | POA: Diagnosis not present

## 2023-03-17 ENCOUNTER — Other Ambulatory Visit: Payer: Self-pay | Admitting: Family Medicine

## 2023-03-17 DIAGNOSIS — E785 Hyperlipidemia, unspecified: Secondary | ICD-10-CM

## 2023-04-16 ENCOUNTER — Encounter: Payer: Self-pay | Admitting: Family Medicine

## 2023-04-16 ENCOUNTER — Ambulatory Visit: Payer: Medicare Other | Admitting: Family Medicine

## 2023-04-16 VITALS — BP 143/76 | HR 79 | Ht 70.0 in | Wt 200.0 lb

## 2023-04-16 DIAGNOSIS — E785 Hyperlipidemia, unspecified: Secondary | ICD-10-CM

## 2023-04-16 DIAGNOSIS — I10 Essential (primary) hypertension: Secondary | ICD-10-CM | POA: Diagnosis not present

## 2023-04-16 DIAGNOSIS — F3342 Major depressive disorder, recurrent, in full remission: Secondary | ICD-10-CM

## 2023-04-16 DIAGNOSIS — F411 Generalized anxiety disorder: Secondary | ICD-10-CM

## 2023-04-16 LAB — LIPID PANEL

## 2023-04-16 MED ORDER — PRAVASTATIN SODIUM 40 MG PO TABS: 40.0000 mg | ORAL_TABLET | Freq: Every day | ORAL | 3 refills | Status: DC

## 2023-04-16 NOTE — Progress Notes (Signed)
BP (!) 143/76   Pulse 79   Ht 5\' 10"  (1.778 m)   Wt 200 lb (90.7 kg)   SpO2 99%   BMI 28.70 kg/m    Subjective:   Patient ID: Isaiah Cook, male    DOB: [birth year], 68 y.o.   MRN: [id number]  HPI: Isaiah Cook is a 68 y.o. male presenting on 04/16/2023 for Medical Management of Chronic Issues, Hyperlipidemia, Hypertension, and Anxiety   HPI Hypertension Patient is currently on telmisartan, and their blood pressure today is 165/77. Patient denies any lightheadedness or dizziness. Patient denies headaches, blurred vision, chest pains, shortness of breath, or weakness. Denies any side effects from medication and is content with current medication.  Patient admits that he did not take his blood pressure pill this morning  Hyperlipidemia Patient is coming in for recheck of his hyperlipidemia. The patient is currently taking pravastatin. They deny any issues with myalgias or history of liver damage from it. They deny any focal numbness or weakness or chest pain.   Anxiety depression recheck Patient is coming in for recheck for anxiety and depression.  He currently takes duloxetine.  He feels like he is doing well.  He still occasionally has some anxiety buildup days but is not very often.  Denies any suicidal ideations or thoughts of hurting himself    04/16/2023    8:03 AM 10/14/2022    8:59 AM 09/26/2022   12:23 PM 03/01/2022   10:02 AM 02/06/2022    8:19 AM  Depression screen PHQ 2/9  Decreased Interest 0 0 0 0 0  Down, Depressed, Hopeless 0 0 0 0 0  PHQ - 2 Score 0 0 0 0 0  Altered sleeping 0 0  0 0  Tired, decreased energy 0 0  0 0  Change in appetite 0 0  0 0  Feeling bad or failure about yourself  0 0  0 0  Trouble concentrating 0 0  0 0  Moving slowly or fidgety/restless 0 0  0 0  Suicidal thoughts 0 0  0 0  PHQ-9 Score 0 0  0 0  Difficult doing work/chores Not difficult at all Not difficult at all  Not difficult at all Not difficult at all    Patient says he  gets occasional joint aches and pains in his hands and sometimes his back.  He feels like they are doing about once a month.  Relevant past medical, surgical, family and social history reviewed and updated as indicated. Interim medical history since our last visit reviewed. Allergies and medications reviewed and updated.  Review of Systems  Constitutional:  Negative for chills and fever.  Eyes:  Negative for visual disturbance.  Respiratory:  Negative for shortness of breath and wheezing.   Cardiovascular:  Negative for chest pain and leg swelling.  Musculoskeletal:  Positive for arthralgias and back pain. Negative for gait problem.  Skin:  Negative for rash.  Neurological:  Negative for dizziness, weakness and light-headedness.  All other systems reviewed and are negative.   Per HPI unless specifically indicated above   Allergies as of 04/16/2023       Reactions   Penicillins Anaphylaxis, Rash        Medication List        Accurate as of April 16, 2023  8:43 AM. If you have any questions, ask your nurse or doctor.          DULoxetine 60 MG capsule Commonly known as: CYMBALTA Take  1 capsule (60 mg total) by mouth 2 (two) times daily.   fluticasone 50 MCG/ACT nasal spray Commonly known as: FLONASE Place 2 sprays into both nostrils daily.   ibuprofen 200 MG tablet Commonly known as: ADVIL Take 200-400 mg by mouth every 6 (six) hours as needed. Pain   neomycin-polymyxin-hydrocortisone OTIC solution Commonly known as: CORTISPORIN Place 3 drops into the left ear 4 (four) times daily.   pravastatin 40 MG tablet Commonly known as: PRAVACHOL Take 1 tablet (40 mg total) by mouth daily. What changed: how much to take Changed by: Elige Radon Neveyah Garzon   telmisartan 40 MG tablet Commonly known as: MICARDIS Take one tablet daily   vitamin C with rose hips 500 MG tablet Take 500 mg by mouth daily.   Zinc 50 MG Caps Take by mouth.         Objective:   BP (!)  165/77   Pulse 79   Ht 5\' 10"  (1.778 m)   Wt 200 lb (90.7 kg)   SpO2 99%   BMI 28.70 kg/m   Wt Readings from Last 3 Encounters:  04/16/23 200 lb (90.7 kg)  10/14/22 209 lb (94.8 kg)  09/26/22 200 lb (90.7 kg)    Physical Exam Vitals and nursing note reviewed.  Constitutional:      General: He is not in acute distress.    Appearance: He is well-developed. He is not diaphoretic.  Eyes:     General: No scleral icterus.    Conjunctiva/sclera: Conjunctivae normal.  Neck:     Thyroid: No thyromegaly.  Cardiovascular:     Rate and Rhythm: Normal rate and regular rhythm.     Heart sounds: Normal heart sounds. No murmur heard. Pulmonary:     Effort: Pulmonary effort is normal. No respiratory distress.     Breath sounds: Normal breath sounds. No wheezing.  Musculoskeletal:        General: Normal range of motion.     Cervical back: Neck supple.  Lymphadenopathy:     Cervical: No cervical adenopathy.  Skin:    General: Skin is warm and dry.     Findings: No rash.  Neurological:     Mental Status: He is alert and oriented to person, place, and time.     Coordination: Coordination normal.  Psychiatric:        Behavior: Behavior normal.       Assessment & Plan:   Problem List Items Addressed This Visit       Cardiovascular and Mediastinum   Essential hypertension - Primary   Relevant Medications   pravastatin (PRAVACHOL) 40 MG tablet   Other Relevant Orders   CBC with Differential/Platelet   CMP14+EGFR   Lipid panel     Other   Anxiety, generalized   Relevant Orders   CBC with Differential/Platelet   Dyslipidemia   Relevant Medications   pravastatin (PRAVACHOL) 40 MG tablet   Other Relevant Orders   CBC with Differential/Platelet   CMP14+EGFR   Lipid panel   Major depressive disorder   Relevant Orders   CBC with Differential/Platelet    Recommended Voltaren gel for his occasional arthralgias  Continue current medicine, blood pressure is elevated, focus on  checking his blood pressure over the next few weeks and let me know if it still running elevated. Follow up plan: Return in about 6 months (around 10/14/2023), or if symptoms worsen or fail to improve, for Physical exam and hyperlipidemia and anxiety and depression.  Counseling provided for all of  the vaccine components Orders Placed This Encounter  Procedures   CBC with Differential/Platelet   CMP14+EGFR   Lipid panel    Arville Care, MD [facility] 04/16/2023, 8:43 AM

## 2023-04-17 LAB — CBC WITH DIFFERENTIAL/PLATELET
Basophils Absolute: 0.1 10*3/uL (ref 0.0–0.2)
Basos: 1 %
EOS (ABSOLUTE): 0.1 10*3/uL (ref 0.0–0.4)
Eos: 1 %
Hematocrit: 46.6 % (ref 37.5–51.0)
Hemoglobin: 16 g/dL (ref 13.0–17.7)
Immature Grans (Abs): 0 10*3/uL (ref 0.0–0.1)
Immature Granulocytes: 0 %
Lymphocytes Absolute: 1.7 10*3/uL (ref 0.7–3.1)
Lymphs: 23 %
MCH: 31.2 pg (ref 26.6–33.0)
MCHC: 34.3 g/dL (ref 31.5–35.7)
MCV: 91 fL (ref 79–97)
Monocytes Absolute: 0.6 10*3/uL (ref 0.1–0.9)
Monocytes: 8 %
Neutrophils Absolute: 4.8 10*3/uL (ref 1.4–7.0)
Neutrophils: 67 %
Platelets: 212 10*3/uL (ref 150–450)
RBC: 5.13 x10E6/uL (ref 4.14–5.80)
RDW: 12.4 % (ref 11.6–15.4)
WBC: 7.3 10*3/uL (ref 3.4–10.8)

## 2023-04-17 LAB — CMP14+EGFR
ALT: 29 [IU]/L (ref 0–44)
AST: 25 [IU]/L (ref 0–40)
Albumin: 4.6 g/dL (ref 3.9–4.9)
Alkaline Phosphatase: 72 [IU]/L (ref 44–121)
BUN/Creatinine Ratio: 19 (ref 10–24)
BUN: 17 mg/dL (ref 8–27)
Bilirubin Total: 0.6 mg/dL (ref 0.0–1.2)
CO2: 23 mmol/L (ref 20–29)
Calcium: 9.6 mg/dL (ref 8.6–10.2)
Chloride: 100 mmol/L (ref 96–106)
Creatinine, Ser: 0.91 mg/dL (ref 0.76–1.27)
Globulin, Total: 1.8 g/dL (ref 1.5–4.5)
Glucose: 99 mg/dL (ref 70–99)
Potassium: 4.3 mmol/L (ref 3.5–5.2)
Sodium: 138 mmol/L (ref 134–144)
Total Protein: 6.4 g/dL (ref 6.0–8.5)
eGFR: 91 mL/min/{1.73_m2} (ref >59)

## 2023-04-17 LAB — LIPID PANEL
Cholesterol, Total: 146 mg/dL (ref 100–199)
HDL: 45 mg/dL (ref >39)
LDL CALC COMMENT:: 3.2 ratio (ref 0.0–5.0)
LDL Chol Calc (NIH): 82 mg/dL (ref 0–99)
Triglycerides: 106 mg/dL (ref 0–149)
VLDL Cholesterol Cal: 19 mg/dL (ref 5–40)

## 2023-04-18 ENCOUNTER — Telehealth: Payer: Self-pay | Admitting: Family Medicine

## 2023-04-18 NOTE — Telephone Encounter (Signed)
Provider has not viewed labs. Pt notified. Will call once reviewed. LS

## 2023-04-18 NOTE — Telephone Encounter (Unsigned)
Copied from CRM [id number]. Topic: Clinical - Lab/Test Results >> Apr 18, 2023 10:16 AM Shelah Lewandowsky wrote: Reason for CRM: link expired to see labs on mychart, please call [phone] for lab results

## 2023-04-24 ENCOUNTER — Encounter: Payer: Self-pay | Admitting: Family Medicine

## 2023-05-30 ENCOUNTER — Ambulatory Visit: Admitting: Family Medicine

## 2023-05-30 ENCOUNTER — Encounter: Payer: Self-pay | Admitting: Family Medicine

## 2023-05-30 VITALS — BP 155/89 | HR 80 | Temp 97.7°F | Ht 70.0 in | Wt 194.4 lb

## 2023-05-30 DIAGNOSIS — J069 Acute upper respiratory infection, unspecified: Secondary | ICD-10-CM | POA: Diagnosis not present

## 2023-05-30 DIAGNOSIS — H66002 Acute suppurative otitis media without spontaneous rupture of ear drum, left ear: Secondary | ICD-10-CM | POA: Diagnosis not present

## 2023-05-30 MED ORDER — CEFDINIR 300 MG PO CAPS: 300.0000 mg | ORAL_CAPSULE | Freq: Two times a day (BID) | ORAL | 0 refills | Status: DC

## 2023-05-30 NOTE — Progress Notes (Signed)
 Subjective:  Patient ID: Isaiah Cook, male    DOB: Jan 24, 1956, 68 y.o.   MRN: [id number]  Patient Care Team: Dettinger, Elige Radon, MD as PCP - General (Family Medicine) Bernette Redbird, MD as Consulting Physician (Gastroenterology) Smitty Cords, OD (Optometry)   Chief Complaint:  Cough and Nasal Congestion (X 1 month )   HPI: Isaiah Cook is a 68 y.o. male presenting on 05/30/2023 for Cough and Nasal Congestion (X 1 month )   Discussed the use of AI scribe software for clinical note transcription with the patient, who gave verbal consent to proceed.  History of Present Illness   Isaiah SIMMERS "Sammy" is a 68 year old male who presents with ear congestion and drainage for over a month.  He has been experiencing ear congestion and drainage for over a month. He describes his ears as 'stocked up' with significant drainage and congestion, noting that 'nasty stuff' comes out of his head. The symptoms have persisted for about two to three weeks, possibly up to a month. He experiences itching in his left ear, which is accompanied by a lot of drainage. No fever is present, as he usually recognizes when he has a fever due to feeling significantly unwell.  He has a cough and produces 'nasty looking stuff' but has not tried any new treatments at home beyond his usual regimen. The cough and respiratory symptoms have been ongoing for approximately 2-3 weeks.  He mentions a history of using a specific antibiotic for his ear issues in the past, although he cannot recall the name. He is allergic to penicillins but has previously tolerated cefdinir (Omnicef) without issues, noting that consuming yogurt helps mitigate any stomach upset from antibiotics.  He has been using Flonase and is considering starting an allergy pill, as he has not been taking one regularly.          Relevant past medical, surgical, family, and social history reviewed and updated as indicated.  Allergies and  medications reviewed and updated. Data reviewed: Chart in Epic.   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    Social History   Socioeconomic History   Marital status: Divorced    Spouse name: Not on file   Number of children: 1   Years of education: Not on file   Highest education level: Not on file  Occupational History   Occupation: retired  Tobacco Use   Smoking status: Never   Smokeless tobacco: Never  Vaping Use   Vaping status: Never Used  Substance and Sexual Activity   Alcohol use: No   Drug use: No   Sexual activity: Not Currently    Comment: divorced  Other Topics Concern   Not on file  Social History Narrative   Lives alone - talks to daughter and sister every day   [place] - he flies out to see her every few months   Social Drivers of Corporate investment banker Strain: Low Risk  (09/26/2022)   Overall Financial Resource Strain (CARDIA)    Difficulty of Paying Living Expenses: Not hard at all  Food Insecurity: No Food Insecurity (09/26/2022)   Hunger Vital Sign    Worried About Running Out of Food in the Last Year: Never true    Ran Out of Food  in the Last Year: Never true  Transportation Needs: No Transportation Needs (09/26/2022)   PRAPARE - Administrator, Civil Service (Medical): No    Lack of Transportation (Non-Medical): No  Physical Activity: Insufficiently Active (09/26/2022)   Exercise Vital Sign    Days of Exercise per Week: 3 days    Minutes of Exercise per Session: 30 min  Stress: No Stress Concern Present (09/26/2022)   [employer] of Occupational Health - Occupational Stress Questionnaire    Feeling of Stress : Not at all  Social Connections: Moderately Integrated (09/26/2022)   Social Connection and Isolation Panel [NHANES]    Frequency of Communication with  Friends and Family: More than three times a week    Frequency of Social Gatherings with Friends and Family: More than three times a week    Attends Religious Services: More than 4 times per year    Active Member of [employer] or Organizations: Yes    Attends Engineer, structural: More than 4 times per year    Marital Status: Divorced  Intimate Partner Violence: Not At Risk (09/26/2022)   Humiliation, Afraid, Rape, and Kick questionnaire    Fear of Current or Ex-Partner: No    Emotionally Abused: No    Physically Abused: No    Sexually Abused: No    Outpatient Encounter Medications as of 05/30/2023  Medication Sig   Ascorbic Acid (VITAMIN C WITH ROSE HIPS) 500 MG tablet Take 500 mg by mouth daily.   cefdinir (OMNICEF) 300 MG capsule Take 1 capsule (300 mg total) by mouth 2 (two) times daily. 1 po BID   DULoxetine (CYMBALTA) 60 MG capsule Take 1 capsule (60 mg total) by mouth 2 (two) times daily.   fluticasone (FLONASE) 50 MCG/ACT nasal spray Place 2 sprays into both nostrils daily.   ibuprofen (ADVIL,MOTRIN) 200 MG tablet Take 200-400 mg by mouth every 6 (six) hours as needed. Pain   pravastatin (PRAVACHOL) 40 MG tablet Take 1 tablet (40 mg total) by mouth daily.   telmisartan (MICARDIS) 40 MG tablet Take one tablet daily   Zinc 50 MG CAPS Take by mouth.   [DISCONTINUED] neomycin-polymyxin-hydrocortisone (CORTISPORIN) OTIC solution Place 3 drops into the left ear 4 (four) times daily.   No facility-administered encounter medications on file as of 05/30/2023.    Allergies  Allergen Reactions   Penicillins Anaphylaxis and Rash    Pertinent ROS per HPI, otherwise unremarkable      Objective:  BP (!) 155/89   Pulse 80   Temp 97.7 F (36.5 C)   Ht 5\' 10"  (1.778 m)   Wt 194 lb 6.4 oz (88.2 kg)   SpO2 96%   BMI 27.89 kg/m    Wt Readings from Last 3 Encounters:  05/30/23 194 lb 6.4 oz (88.2 kg)  04/16/23 200 lb (90.7 kg)  10/14/22 209 lb (94.8 kg)    Physical  Exam Vitals and nursing note reviewed.  Constitutional:      General: He is not in acute distress.    Appearance: Normal appearance. He is not ill-appearing, toxic-appearing or diaphoretic.  HENT:     Head: Normocephalic and atraumatic.     Right Ear: A middle ear effusion is present.     Left Ear: Tympanic membrane is erythematous and bulging.     Nose: Congestion present.     Right Turbinates: Swollen.     Left Turbinates: Swollen.     Right Sinus: No maxillary sinus tenderness or frontal sinus tenderness.  Left Sinus: No maxillary sinus tenderness or frontal sinus tenderness.     Mouth/Throat:     Lips: Pink.     Mouth: Mucous membranes are moist.     Pharynx: Posterior oropharyngeal erythema and postnasal drip present. No pharyngeal swelling, oropharyngeal exudate or uvula swelling.  Eyes:     Conjunctiva/sclera: Conjunctivae normal.     Pupils: Pupils are equal, round, and reactive to light.  Cardiovascular:     Rate and Rhythm: Normal rate and regular rhythm.     Heart sounds: Normal heart sounds.  Pulmonary:     Effort: Pulmonary effort is normal.  Musculoskeletal:     Cervical back: Neck supple.  Skin:    General: Skin is warm and dry.     Capillary Refill: Capillary refill takes less than 2 seconds.  Neurological:     General: No focal deficit present.     Mental Status: He is alert and oriented to person, place, and time.  Psychiatric:        Mood and Affect: Mood normal.        Behavior: Behavior normal. Behavior is cooperative.        Thought Content: Thought content normal.        Judgment: Judgment normal.       Results for orders placed or performed in visit on 04/16/23  CBC with Differential/Platelet   Collection Time: 04/16/23  8:36 AM  Result Value Ref Range   WBC 7.3 3.4 - 10.8 x10E3/uL   RBC 5.13 4.14 - 5.80 x10E6/uL   Hemoglobin 16.0 13.0 - 17.7 g/dL   Hematocrit 60.4 54.0 - 51.0 %   MCV 91 79 - 97 fL   MCH 31.2 26.6 - 33.0 pg   MCHC 34.3  31.5 - 35.7 g/dL   RDW 98.1 19.1 - 47.8 %   Platelets 212 150 - 450 x10E3/uL   Neutrophils 67 Not Estab. %   Lymphs 23 Not Estab. %   Monocytes 8 Not Estab. %   Eos 1 Not Estab. %   Basos 1 Not Estab. %   Neutrophils Absolute 4.8 1.4 - 7.0 x10E3/uL   Lymphocytes Absolute 1.7 0.7 - 3.1 x10E3/uL   Monocytes Absolute 0.6 0.1 - 0.9 x10E3/uL   EOS (ABSOLUTE) 0.1 0.0 - 0.4 x10E3/uL   Basophils Absolute 0.1 0.0 - 0.2 x10E3/uL   Immature Granulocytes 0 Not Estab. %   Immature Grans (Abs) 0.0 0.0 - 0.1 x10E3/uL  CMP14+EGFR   Collection Time: 04/16/23  8:36 AM  Result Value Ref Range   Glucose 99 70 - 99 mg/dL   BUN 17 8 - 27 mg/dL   Creatinine, Ser 2.95 0.76 - 1.27 mg/dL   eGFR 91 >62 ZH/YQM/5.78   BUN/Creatinine Ratio 19 10 - 24   Sodium 138 134 - 144 mmol/L   Potassium 4.3 3.5 - 5.2 mmol/L   Chloride 100 96 - 106 mmol/L   CO2 23 20 - 29 mmol/L   Calcium 9.6 8.6 - 10.2 mg/dL   Total Protein 6.4 6.0 - 8.5 g/dL   Albumin 4.6 3.9 - 4.9 g/dL   Globulin, Total 1.8 1.5 - 4.5 g/dL   Bilirubin Total 0.6 0.0 - 1.2 mg/dL   Alkaline Phosphatase 72 44 - 121 IU/L   AST 25 0 - 40 IU/L   ALT 29 0 - 44 IU/L  Lipid panel   Collection Time: 04/16/23  8:36 AM  Result Value Ref Range   Cholesterol, Total 146 100 - 199 mg/dL  Triglycerides 106 0 - 149 mg/dL   HDL 45 >16 mg/dL   VLDL Cholesterol Cal 19 5 - 40 mg/dL   LDL Chol Calc (NIH) 82 0 - 99 mg/dL   Chol/HDL Ratio 3.2 0.0 - 5.0 ratio       Pertinent labs & imaging results that were available during my care of the patient were reviewed by me and considered in my medical decision making.  Assessment & Plan:  Isaiah "Sammy" was seen today for cough and nasal congestion.  Diagnoses and all orders for this visit:  URI with cough and congestion -     cefdinir (OMNICEF) 300 MG capsule; Take 1 capsule (300 mg total) by mouth 2 (two) times daily. 1 po BID  Non-recurrent acute suppurative otitis media of left ear without spontaneous rupture of  tympanic membrane -     cefdinir (OMNICEF) 300 MG capsule; Take 1 capsule (300 mg total) by mouth 2 (two) times daily. 1 po BID     Assessment and Plan    Otitis media Symptoms of ear congestion, drainage, and itching for over a month indicate a chronic ear infection. The left ear is red and inflamed, suggesting an active infection. Due to penicillin allergy, cefdinir [place]) is chosen as an alternative antibiotic, which he has tolerated in the past. - Prescribe cefdinir (Omnicef) for ear infection - Advise consumption of yogurt to mitigate potential gastrointestinal side effects of cefdinir - Encourage increased water intake  Upper respiratory tract infection Reports of cough and nasal drainage for the past two to three weeks are consistent with an upper respiratory tract infection. Absence of fever suggests a less severe infection. Over-the-counter Mucinex is recommended for symptom relief. He should continue using Flonase as previously prescribed and start an over-the-counter allergy medication such as Xyzal or Zyrtec, especially during season changes, to help manage symptoms. - Recommend over-the-counter Mucinex for symptom relief - Advise use of Flonase as previously prescribed - Start an over-the-counter allergy medication such as Xyzal or Zyrtec, especially during season changes          Continue all other maintenance medications.  Follow up plan: Return if symptoms worsen or fail to improve.   Continue healthy lifestyle choices, including diet (rich in fruits, vegetables, and lean proteins, and low in salt and simple carbohydrates) and exercise (at least 30 minutes of moderate physical activity daily).   The above assessment and management plan was discussed with the patient. The patient verbalized understanding of and has agreed to the management plan. Patient is aware to call the clinic if they develop any new symptoms or if symptoms persist or worsen. Patient is aware  when to return to the clinic for a follow-up visit. Patient educated on when it is appropriate to go to the emergency department.   Kari Baars, FNP-C [facility] [phone]

## 2023-08-01 ENCOUNTER — Other Ambulatory Visit: Payer: Self-pay | Admitting: Family Medicine

## 2023-08-01 DIAGNOSIS — F3342 Major depressive disorder, recurrent, in full remission: Secondary | ICD-10-CM

## 2023-08-25 ENCOUNTER — Ambulatory Visit (INDEPENDENT_AMBULATORY_CARE_PROVIDER_SITE_OTHER): Admitting: Family Medicine

## 2023-08-25 ENCOUNTER — Encounter: Payer: Self-pay | Admitting: Family Medicine

## 2023-08-25 VITALS — BP 120/65 | HR 90 | Ht 70.0 in | Wt 188.0 lb

## 2023-08-25 DIAGNOSIS — I10 Essential (primary) hypertension: Secondary | ICD-10-CM

## 2023-08-25 DIAGNOSIS — F411 Generalized anxiety disorder: Secondary | ICD-10-CM

## 2023-08-25 DIAGNOSIS — Z125 Encounter for screening for malignant neoplasm of prostate: Secondary | ICD-10-CM

## 2023-08-25 DIAGNOSIS — F3342 Major depressive disorder, recurrent, in full remission: Secondary | ICD-10-CM | POA: Diagnosis not present

## 2023-08-25 DIAGNOSIS — E785 Hyperlipidemia, unspecified: Secondary | ICD-10-CM | POA: Diagnosis not present

## 2023-08-25 MED ORDER — TELMISARTAN 40 MG PO TABS: ORAL_TABLET | ORAL | 3 refills | Status: AC

## 2023-08-25 MED ORDER — DULOXETINE HCL 60 MG PO CPEP
60.0000 mg | ORAL_CAPSULE | Freq: Two times a day (BID) | ORAL | 3 refills | Status: AC
Start: 2023-08-25 — End: ?

## 2023-08-25 NOTE — Progress Notes (Signed)
 BP 120/65   Pulse 90   Ht 5\' 10"  (1.778 m)   Wt 188 lb (85.3 kg)   SpO2 96%   BMI 26.98 kg/m    Subjective:   Patient ID: Isaiah Cook, male    DOB: 1956/01/19, 68 y.o.   MRN: [id number]  HPI: Isaiah Cook is a 68 y.o. male presenting on 08/25/2023 for Medical Management of Chronic Issues, Hypertension, and Anxiety   HPI Hyperlipidemia Patient is coming in for recheck of his hyperlipidemia. The patient is currently taking pravastatin . They deny any issues with myalgias or history of liver damage from it. They deny any focal numbness or weakness or chest pain.   Hypertension Patient is currently on losartan, and their blood pressure today is 120/65. Patient denies any lightheadedness or dizziness. Patient denies headaches, blurred vision, chest pains, shortness of breath, or weakness. Denies any side effects from medication and is content with current medication.   Anxiety and depression recheck. Patient is coming in today for anxiety and depression recheck.  He is currently taking duloxetine  60 mg twice a day.  He feels like things are going pretty well and is very happy because his daughters moved back this way and pretty happy with life right now except for the occasional joint aches and pains.    08/25/2023   12:56 PM 04/16/2023    8:03 AM 10/14/2022    8:59 AM 09/26/2022   12:23 PM 03/01/2022   10:02 AM  Depression screen PHQ 2/9  Decreased Interest 0 0 0 0 0  Down, Depressed, Hopeless 0 0 0 0 0  PHQ - 2 Score 0 0 0 0 0  Altered sleeping 0 0 0  0  Tired, decreased energy 0 0 0  0  Change in appetite 0 0 0  0  Feeling bad or failure about yourself  0 0 0  0  Trouble concentrating 0 0 0  0  Moving slowly or fidgety/restless 0 0 0  0  Suicidal thoughts 0 0 0  0  PHQ-9 Score 0 0 0  0  Difficult doing work/chores Not difficult at all Not difficult at all Not difficult at all  Not difficult at all     Relevant past medical, surgical, family and social history reviewed  and updated as indicated. Interim medical history since our last visit reviewed. Allergies and medications reviewed and updated.  Review of Systems  Constitutional:  Negative for chills and fever.  Eyes:  Negative for visual disturbance.  Respiratory:  Negative for shortness of breath and wheezing.   Cardiovascular:  Negative for chest pain and leg swelling.  Musculoskeletal:  Negative for back pain and gait problem.  Skin:  Negative for rash.  Neurological:  Negative for dizziness, weakness and light-headedness.  All other systems reviewed and are negative.   Per HPI unless specifically indicated above   Allergies as of 08/25/2023       Reactions   Penicillins Anaphylaxis, Rash        Medication List        Accurate as of August 25, 2023  1:07 PM. If you have any questions, ask your nurse or doctor.          cefdinir  300 MG capsule Commonly known as: OMNICEF  Take 1 capsule (300 mg total) by mouth 2 (two) times daily. 1 po BID   DULoxetine  60 MG capsule Commonly known as: CYMBALTA  Take 1 capsule (60 mg total) by mouth 2 (two) times daily.  What changed: See the new instructions. Changed by: Lucio Sabin Lisha Vitale   fluticasone  50 MCG/ACT nasal spray Commonly known as: FLONASE  Place 2 sprays into both nostrils daily.   ibuprofen 200 MG tablet Commonly known as: ADVIL Take 200-400 mg by mouth every 6 (six) hours as needed. Pain   pravastatin  40 MG tablet Commonly known as: PRAVACHOL  Take 1 tablet (40 mg total) by mouth daily.   telmisartan  40 MG tablet Commonly known as: MICARDIS  Take one tablet daily   vitamin C with rose hips 500 MG tablet Take 500 mg by mouth daily.   Zinc 50 MG Caps Take by mouth.         Objective:   BP 120/65   Pulse 90   Ht 5\' 10"  (1.778 m)   Wt 188 lb (85.3 kg)   SpO2 96%   BMI 26.98 kg/m   Wt Readings from Last 3 Encounters:  08/25/23 188 lb (85.3 kg)  05/30/23 194 lb 6.4 oz (88.2 kg)  04/16/23 200 lb (90.7 kg)     Physical Exam Vitals and nursing note reviewed.  Constitutional:      General: He is not in acute distress.    Appearance: He is well-developed. He is not diaphoretic.  Eyes:     General: No scleral icterus.    Conjunctiva/sclera: Conjunctivae normal.  Neck:     Thyroid: No thyromegaly.  Cardiovascular:     Rate and Rhythm: Normal rate and regular rhythm.     Heart sounds: Normal heart sounds. No murmur heard. Pulmonary:     Effort: Pulmonary effort is normal. No respiratory distress.     Breath sounds: Normal breath sounds. No wheezing.  Musculoskeletal:        General: No swelling. Normal range of motion.     Cervical back: Neck supple.  Lymphadenopathy:     Cervical: No cervical adenopathy.  Skin:    General: Skin is warm and dry.     Findings: No rash.  Neurological:     Mental Status: He is alert and oriented to person, place, and time.     Coordination: Coordination normal.  Psychiatric:        Behavior: Behavior normal.       Assessment & Plan:   Problem List Items Addressed This Visit       Cardiovascular and Mediastinum   Essential hypertension   Relevant Medications   telmisartan  (MICARDIS ) 40 MG tablet   Other Relevant Orders   CMP14+EGFR   Lipid panel     Other   Anxiety, generalized - Primary   Relevant Medications   DULoxetine  (CYMBALTA ) 60 MG capsule   Other Relevant Orders   CBC with Differential/Platelet   Dyslipidemia   Relevant Orders   CMP14+EGFR   Lipid panel   Major depressive disorder   Relevant Medications   DULoxetine  (CYMBALTA ) 60 MG capsule   Other Visit Diagnoses       Prostate cancer screening       Relevant Orders   PSA, total and free     Seems to be doing well, refill current medicines, no major changes.  Blood pressure looks good today.  No changes  Follow up plan: Return in about 6 months (around 02/24/2024), or if symptoms worsen or fail to improve, for Physical exam and hypertension and anxiety  recheck.  Counseling provided for all of the vaccine components Orders Placed This Encounter  Procedures   CBC with Differential/Platelet   CMP14+EGFR   Lipid panel  PSA, total and free    Jolyne Needs, MD [facility] 08/25/2023, 1:07 PM

## 2023-08-26 LAB — CMP14+EGFR
ALT: 23 IU/L (ref 0–44)
AST: 25 IU/L (ref 0–40)
Albumin: 4.4 g/dL (ref 3.9–4.9)
Alkaline Phosphatase: 69 IU/L (ref 44–121)
BUN/Creatinine Ratio: 18 (ref 10–24)
BUN: 16 mg/dL (ref 8–27)
Bilirubin Total: 0.6 mg/dL (ref 0.0–1.2)
CO2: 24 mmol/L (ref 20–29)
Calcium: 9.4 mg/dL (ref 8.6–10.2)
Chloride: 101 mmol/L (ref 96–106)
Creatinine, Ser: 0.89 mg/dL (ref 0.76–1.27)
Globulin, Total: 1.9 g/dL (ref 1.5–4.5)
Glucose: 87 mg/dL (ref 70–99)
Potassium: 4.2 mmol/L (ref 3.5–5.2)
Sodium: 139 mmol/L (ref 134–144)
Total Protein: 6.3 g/dL (ref 6.0–8.5)
eGFR: 92 mL/min/{1.73_m2} (ref >59)

## 2023-08-26 LAB — CBC WITH DIFFERENTIAL/PLATELET
Basophils Absolute: 0.1 10*3/uL (ref 0.0–0.2)
Basos: 1 %
EOS (ABSOLUTE): 0.2 10*3/uL (ref 0.0–0.4)
Eos: 2 %
Hematocrit: 47.2 % (ref 37.5–51.0)
Hemoglobin: 15.8 g/dL (ref 13.0–17.7)
Immature Grans (Abs): 0 10*3/uL (ref 0.0–0.1)
Immature Granulocytes: 0 %
Lymphocytes Absolute: 2.2 10*3/uL (ref 0.7–3.1)
Lymphs: 27 %
MCH: 31.2 pg (ref 26.6–33.0)
MCHC: 33.5 g/dL (ref 31.5–35.7)
MCV: 93 fL (ref 79–97)
Monocytes Absolute: 0.5 10*3/uL (ref 0.1–0.9)
Monocytes: 6 %
Neutrophils Absolute: 5.3 10*3/uL (ref 1.4–7.0)
Neutrophils: 64 %
Platelets: 201 10*3/uL (ref 150–450)
RBC: 5.07 x10E6/uL (ref 4.14–5.80)
RDW: 13.1 % (ref 11.6–15.4)
WBC: 8.2 10*3/uL (ref 3.4–10.8)

## 2023-08-26 LAB — LIPID PANEL
Chol/HDL Ratio: 3.3 ratio (ref 0.0–5.0)
Cholesterol, Total: 153 mg/dL (ref 100–199)
HDL: 46 mg/dL (ref >39)
LDL Chol Calc (NIH): 88 mg/dL (ref 0–99)
Triglycerides: 106 mg/dL (ref 0–149)
VLDL Cholesterol Cal: 19 mg/dL (ref 5–40)

## 2023-08-26 LAB — PSA, TOTAL AND FREE
PSA, Free Pct: 23.3 %
PSA, Free: 0.14 ng/mL
Prostate Specific Ag, Serum: 0.6 ng/mL (ref 0.0–4.0)

## 2023-08-27 ENCOUNTER — Other Ambulatory Visit: Payer: Self-pay | Admitting: Family Medicine

## 2023-08-27 DIAGNOSIS — J069 Acute upper respiratory infection, unspecified: Secondary | ICD-10-CM

## 2023-08-29 ENCOUNTER — Ambulatory Visit: Payer: Self-pay | Admitting: Family Medicine

## 2023-08-29 ENCOUNTER — Telehealth: Payer: Self-pay | Admitting: Family Medicine

## 2023-08-29 NOTE — Telephone Encounter (Signed)
 Copied from CRM [id number]. Topic: Clinical - Lab/Test Results >> Aug 29, 2023 10:43 AM Lorenz Romano B wrote: Reason for CRM: please call patient to go over test result [phone]

## 2023-08-29 NOTE — Telephone Encounter (Signed)
 Attempted to call pt , no answer left detailed message to call back   when patient calls back please relay message to patient

## 2023-09-01 ENCOUNTER — Telehealth: Payer: Self-pay | Admitting: Family Medicine

## 2023-09-01 NOTE — Telephone Encounter (Unsigned)
 Copied from CRM [id number]. Topic: Clinical - Lab/Test Results >> Sep 01, 2023 11:03 AM Stanly Early wrote: Reason for CRM: relayed the labs to the patient.

## 2023-09-02 NOTE — Telephone Encounter (Signed)
 Spoke with pt. He is aware of his results and does not have any concerns.

## 2023-09-29 ENCOUNTER — Ambulatory Visit: Payer: Medicare Other

## 2023-09-29 VITALS — BP 120/65 | HR 90 | Ht 70.0 in | Wt 188.0 lb

## 2023-09-29 DIAGNOSIS — Z0000 Encounter for general adult medical examination without abnormal findings: Secondary | ICD-10-CM

## 2023-09-29 NOTE — Progress Notes (Signed)
 Subjective:   Isaiah Cook is a 68 y.o. who presents for a Medicare Wellness preventive visit.  As a reminder, Annual Wellness Visits don't include a physical exam, and some assessments may be limited, especially if this visit is performed virtually. We may recommend an in-person follow-up visit with your provider if needed.  Visit Complete: Virtual I connected with  CASIN FEDERICI on 09/29/23 by a audio enabled telemedicine application and verified that I am speaking with the correct person using two identifiers.  Patient Location: Home  Provider Location: Home Office  I discussed the limitations of evaluation and management by telemedicine. The patient expressed understanding and agreed to proceed.  Vital Signs: Because this visit was a virtual/telehealth visit, some criteria may be missing or patient reported. Any vitals not documented were not able to be obtained and vitals that have been documented are patient reported.  VideoDeclined- This patient declined Librarian, academic. Therefore the visit was completed with audio only.  Persons Participating in Visit: Patient.  AWV Questionnaire: No: Patient Medicare AWV questionnaire was not completed prior to this visit.  Cardiac Risk Factors include: advanced age (>72men, >83 women);male gender;dyslipidemia;hypertension     Objective:    Today's Vitals   09/29/23 1020  BP: 120/65  Pulse: 90  Weight: 188 lb (85.3 kg)  Height: 5' 10 (1.778 m)   Body mass index is 26.98 kg/m.     09/29/2023   10:35 AM 09/26/2022   12:24 PM 09/24/2021    9:59 AM 09/21/2020    9:13 AM  Advanced Directives  Does Patient Have a Medical Advance Directive? No Yes Yes No  Type of Special educational needs teacher of [place];Living will Healthcare Power of [place];Living will   Copy of Healthcare Power of Attorney in Chart?  No - copy requested No - copy requested   Would patient like information on creating a  medical advance directive?    No - Patient declined    Current Medications (verified) Outpatient Encounter Medications as of 09/29/2023  Medication Sig   Ascorbic Acid (VITAMIN C WITH ROSE HIPS) 500 MG tablet Take 500 mg by mouth daily.   DULoxetine  (CYMBALTA ) 60 MG capsule Take 1 capsule (60 mg total) by mouth 2 (two) times daily.   ibuprofen (ADVIL,MOTRIN) 200 MG tablet Take 200-400 mg by mouth every 6 (six) hours as needed. Pain   pravastatin  (PRAVACHOL ) 40 MG tablet Take 1 tablet (40 mg total) by mouth daily.   telmisartan  (MICARDIS ) 40 MG tablet Take one tablet daily   Zinc 50 MG CAPS Take by mouth.   cefdinir  (OMNICEF ) 300 MG capsule Take 1 capsule (300 mg total) by mouth 2 (two) times daily. 1 po BID (Patient not taking: Reported on 09/29/2023)   fluticasone  (FLONASE ) 50 MCG/ACT nasal spray USE 2 SPRAYS IN EACH NOSTRIL DAILY (Patient not taking: Reported on 09/29/2023)   No facility-administered encounter medications on file as of 09/29/2023.    Allergies (verified) Penicillins   History: Past Medical History:  Diagnosis Date   Bronchitis    Hypertension    Past Surgical History:  Procedure Laterality Date   COLONOSCOPY  01/16/2010   cranial steal plate  8034   Family History  Problem Relation Age of Onset   Cancer Mother        colon   Alzheimer's disease Mother    Heart attack Father    Social History   Socioeconomic History   Marital status: Divorced  Spouse name: Not on file   Number of children: 1   Years of education: Not on file   Highest education level: Not on file  Occupational History   Occupation: retired  Tobacco Use   Smoking status: Never   Smokeless tobacco: Never  Vaping Use   Vaping status: Never Used  Substance and Sexual Activity   Alcohol use: No   Drug use: No   Sexual activity: Not Currently    Comment: divorced  Other Topics Concern   Not on file  Social History Narrative   Lives alone - talks to daughter and sister every day    Daughter lives in [place] - he flies out to see her every few months   Social Drivers of Corporate investment banker Strain: Low Risk  (09/29/2023)   Overall Financial Resource Strain (CARDIA)    Difficulty of Paying Living Expenses: Not hard at all  Food Insecurity: No Food Insecurity (09/29/2023)   Hunger Vital Sign    Worried About Running Out of Food in the Last Year: Never true    Ran Out of Food in the Last Year: Never true  Transportation Needs: No Transportation Needs (09/29/2023)   PRAPARE - Administrator, Civil Service (Medical): No    Lack of Transportation (Non-Medical): No  Physical Activity: Insufficiently Active (09/29/2023)   Exercise Vital Sign    Days of Exercise per Week: 3 days    Minutes of Exercise per Session: 30 min  Stress: No Stress Concern Present (09/29/2023)   [employer] of Occupational Health - Occupational Stress Questionnaire    Feeling of Stress: Not at all  Social Connections: Moderately Integrated (09/29/2023)   Social Connection and Isolation Panel    Frequency of Communication with Friends and Family: More than three times a week    Frequency of Social Gatherings with Friends and Family: More than three times a week    Attends Religious Services: More than 4 times per year    Active Member of [employer] or Organizations: Yes    Attends Banker Meetings: Never    Marital Status: Divorced    Tobacco Counseling Counseling given: Yes    Clinical Intake:  Pre-visit preparation completed: Yes  Pain : No/denies pain     BMI - recorded: 26.98 Nutritional Status: BMI 25 -29 Overweight Nutritional Risks: None Diabetes: No  No results found for: HGBA1C   How often do you need to have someone help you when you read instructions, pamphlets, or other written materials from your doctor or pharmacy?: 1 - Never  Interpreter Needed?: No  Information entered by :: alia t/cma   Activities of Daily Living      09/29/2023   10:33 AM  In your present state of health, do you have any difficulty performing the following activities:  Hearing? 0  Vision? 0  Difficulty concentrating or making decisions? 0  Walking or climbing stairs? 0  Dressing or bathing? 0  Doing errands, shopping? 0  Preparing Food and eating ? N  Using the Toilet? N  In the past six months, have you accidently leaked urine? N  Do you have problems with loss of bowel control? N  Managing your Medications? N  Managing your Finances? N  Housekeeping or managing your Housekeeping? N    Patient Care Team: Dettinger, Fonda LABOR, MD as PCP - General (Family Medicine) Donnald Charleston, MD as Consulting Physician (Gastroenterology) Gladis Gearing, OD (Optometry)  I have updated  your Care Teams any recent Medical Services you may have received from other providers in the past year.     Assessment:   This is a routine wellness examination for Fadil.  Hearing/Vision screen Hearing Screening - Comments:: Pt denies hearing dif Vision Screening - Comments:: Pt wear glasses and denies vision dif/pt goes [facility]   Goals Addressed             This Visit's Progress    Exercise 3x per week (30 min per time)   On track      Depression Screen     09/29/2023   10:39 AM 08/25/2023   12:56 PM 04/16/2023    8:03 AM 10/14/2022    8:59 AM 09/26/2022   12:23 PM 03/01/2022   10:02 AM 02/06/2022    8:19 AM  PHQ 2/9 Scores  PHQ - 2 Score 0 0 0 0 0 0 0  PHQ- 9 Score 0 0 0 0  0 0    Fall Risk     09/29/2023   10:22 AM 08/25/2023   12:56 PM 04/16/2023    8:03 AM 10/14/2022    8:58 AM 09/26/2022   12:21 PM  Fall Risk   Falls in the past year? 0 0 0 0 0  Number falls in past yr: 0 0   0  Injury with Fall? 0 0   0  Risk for fall due to : No Fall Risks No Fall Risks   No Fall Risks  Follow up Falls evaluation completed Falls evaluation completed   Falls prevention discussed    MEDICARE RISK AT HOME:  Medicare Risk at  Home Any stairs in or around the home?: No If so, are there any without handrails?: No Home free of loose throw rugs in walkways, pet beds, electrical cords, etc?: Yes Adequate lighting in your home to reduce risk of falls?: Yes Life alert?: No Use of a cane, walker or w/c?: No Grab bars in the bathroom?: No Shower chair or bench in shower?: No Elevated toilet seat or a handicapped toilet?: Yes  TIMED UP AND GO:  Was the test performed?  no  Cognitive Function: 6CIT completed        09/29/2023   10:41 AM 09/26/2022   12:24 PM 09/24/2021   10:01 AM 09/21/2020    9:17 AM  6CIT Screen  What Year? 0 points 0 points 0 points 0 points  What month? 0 points 0 points 0 points 0 points  What time? 0 points 0 points 0 points 0 points  Count back from 20 0 points 0 points 0 points 0 points  Months in reverse 0 points 0 points 2 points 0 points  Repeat phrase 4 points 0 points 10 points 0 points  Total Score 4 points 0 points 12 points 0 points    Immunizations Immunization History  Administered Date(s) Administered   Fluad Quad(high Dose 65+) 12/23/2018, 01/12/2020, 02/06/2022   Fluad Trivalent(High Dose 65+) 01/01/2023   Influenza,inj,Quad PF,6+ Mos 01/11/2021   Moderna Sars-Covid-2 Vaccination 05/13/2019, 06/11/2019   Pneumococcal Conjugate-13 08/18/2018   Pneumococcal Polysaccharide-23 01/12/2020   Tdap 11/25/2008, 12/23/2018   Zoster Recombinant(Shingrix ) 08/09/2021, 02/06/2022    Screening Tests Health Maintenance  Topic Date Due   COVID-19 Vaccine (3 - 2024-25 season) 11/17/2022   Hepatitis C Screening  10/14/2023 (Originally 05/14/1973)   INFLUENZA VACCINE  10/17/2023   Medicare Annual Wellness (AWV)  09/28/2024   Colonoscopy  11/15/2025   DTaP/Tdap/Td (3 -  Td or Tdap) 12/22/2028   Pneumococcal Vaccine: 50+ Years  Completed   Zoster Vaccines- Shingrix   Completed   Hepatitis B Vaccines  Aged Out   HPV VACCINES  Aged Out   Meningococcal B Vaccine  Aged Out    Health  Maintenance  Health Maintenance Due  Topic Date Due   COVID-19 Vaccine (3 - 2024-25 season) 11/17/2022   Health Maintenance Items Addressed: See Nurse Notes at the end of this note  Additional Screening:  Vision Screening: Recommended annual ophthalmology exams for early detection of glaucoma and other disorders of the eye. Would you like a referral to an eye doctor? No    Dental Screening: Recommended annual dental exams for proper oral hygiene  Community Resource Referral / Chronic Care Management: CRR required this visit?  No   CCM required this visit?  No   Plan:    I have personally reviewed and noted the following in the patient's chart:   Medical and social history Use of alcohol, tobacco or illicit drugs  Current medications and supplements including opioid prescriptions. Patient is not currently taking opioid prescriptions. Functional ability and status Nutritional status Physical activity Advanced directives List of other physicians Hospitalizations, surgeries, and ER visits in previous 12 months Vitals Screenings to include cognitive, depression, and falls Referrals and appointments  In addition, I have reviewed and discussed with patient certain preventive protocols, quality metrics, and best practice recommendations. A written personalized care plan for preventive services as well as general preventive health recommendations were provided to patient.   Ozie Ned, CMA   09/29/2023   After Visit Summary: (MyChart) Due to this being a telephonic visit, the after visit summary with patients personalized plan was offered to patient via MyChart   Notes: Nothing significant to report at this time.

## 2023-09-29 NOTE — Patient Instructions (Signed)
 Isaiah Cook , Thank you for taking time out of your busy schedule to complete your Annual Wellness Visit with me. I enjoyed our conversation and look forward to speaking with you again next year. I, as well as your care team,  appreciate your ongoing commitment to your health goals. Please review the following plan we discussed and let me know if I can assist you in the future. Your Game plan/ To Do List   Follow up Visits: Next Medicare AWV with our clinical staff: 09/29/24 at 12:30p.m.    Next Office Visit with your provider: 02/25/24 at 12:55p.m.  Clinician Recommendations:  Aim for 30 minutes of exercise or brisk walking, 6-8 glasses of water, and 5 servings of fruits and vegetables each day.       This is a list of the screening recommended for you and due dates:  Health Maintenance  Topic Date Due   COVID-19 Vaccine (3 - 2024-25 season) 11/17/2022   Medicare Annual Wellness Visit  09/26/2023   Hepatitis C Screening  10/14/2023*   Flu Shot  10/17/2023   Colon Cancer Screening  11/15/2025   DTaP/Tdap/Td vaccine (3 - Td or Tdap) 12/22/2028   Pneumococcal Vaccine for age over 89  Completed   Zoster (Shingles) Vaccine  Completed   Hepatitis B Vaccine  Aged Out   HPV Vaccine  Aged Out   Meningitis B Vaccine  Aged Out  *Topic was postponed. The date shown is not the original due date.    Advanced directives: (Declined) Advance directive discussed with you today. Even though you declined this today, please call our office should you change your mind, and we can give you the proper paperwork for you to fill out. Advance Care Planning is important because it:  [x]  Makes sure you receive the medical care that is consistent with your values, goals, and preferences  [x]  It provides guidance to your family and loved ones and reduces their decisional burden about whether or not they are making the right decisions based on your wishes.  Follow the link provided in your after visit summary or  read over the paperwork we have mailed to you to help you started getting your Advance Directives in place. If you need assistance in completing these, please reach out to us  so that we can help you!  See attachments for Preventive Care and Fall Prevention Tips.

## 2023-10-15 ENCOUNTER — Encounter: Payer: Medicare Other | Admitting: Family Medicine

## 2024-02-25 ENCOUNTER — Encounter: Payer: Self-pay | Admitting: Family Medicine

## 2024-02-25 ENCOUNTER — Ambulatory Visit (INDEPENDENT_AMBULATORY_CARE_PROVIDER_SITE_OTHER): Payer: Self-pay | Admitting: Family Medicine

## 2024-02-25 VITALS — BP 147/87 | HR 98 | Ht 70.0 in | Wt 201.0 lb

## 2024-02-25 DIAGNOSIS — Z0000 Encounter for general adult medical examination without abnormal findings: Secondary | ICD-10-CM

## 2024-02-25 DIAGNOSIS — F411 Generalized anxiety disorder: Secondary | ICD-10-CM | POA: Diagnosis not present

## 2024-02-25 DIAGNOSIS — Z0001 Encounter for general adult medical examination with abnormal findings: Secondary | ICD-10-CM | POA: Diagnosis not present

## 2024-02-25 DIAGNOSIS — E785 Hyperlipidemia, unspecified: Secondary | ICD-10-CM

## 2024-02-25 DIAGNOSIS — I10 Essential (primary) hypertension: Secondary | ICD-10-CM | POA: Diagnosis not present

## 2024-02-25 DIAGNOSIS — Z23 Encounter for immunization: Secondary | ICD-10-CM | POA: Diagnosis not present

## 2024-02-25 MED ORDER — PRAVASTATIN SODIUM 40 MG PO TABS: 40.0000 mg | ORAL_TABLET | Freq: Every day | ORAL | 3 refills | Status: AC

## 2024-02-25 NOTE — Progress Notes (Signed)
 BP (!) 147/87   Pulse 98   Ht 5' 10 (1.778 m)   Wt 201 lb (91.2 kg)   SpO2 95%   BMI 28.84 kg/m    Subjective:   Patient ID: Isaiah Cook, male    DOB: 05/24/1955, 68 y.o.   MRN: [id number]  HPI: Isaiah Cook is a 68 y.o. male presenting on 02/25/2024 for Medical Management of Chronic Issues (CPE), Hypertension, and Anxiety   Discussed the use of AI scribe software for clinical note transcription with the patient, who gave verbal consent to proceed.  History of Present Illness   Isaiah Cook is a 68 year old male who presents for a routine physical exam and checkup.  Hypertension - Blood pressure measured at 147/87 mmHg today.  Dyslipidemia - Currently taking a cholesterol medication without issues.  Mood regulation - Taking duloxetine  (Cymbalta ) for mood regulation. - Mood symptoms improved with current regimen.  Pruritus auris (ear itching) - Experiencing itching in both ears. - Using peroxide in ears, which may be causing dryness.  Actinic skin changes - Sun damage present on skin, particularly with rough spots. - Using CeraVe moisturizer for affected areas.        02/25/2024    1:11 PM 09/29/2023   10:39 AM 08/25/2023   12:56 PM 04/16/2023    8:03 AM 10/14/2022    8:59 AM  Depression screen PHQ 2/9  Decreased Interest 0 0 0 0 0  Down, Depressed, Hopeless 0 0 0 0 0  PHQ - 2 Score 0 0 0 0 0  Altered sleeping 0 0 0 0 0  Tired, decreased energy 0 0 0 0 0  Change in appetite 0 0 0 0 0  Feeling bad or failure about yourself  0 0 0 0 0  Trouble concentrating 0 0 0 0 0  Moving slowly or fidgety/restless 0 0 0 0 0  Suicidal thoughts 0 0 0 0 0  PHQ-9 Score 0 0  0  0  0   Difficult doing work/chores Not difficult at all Not difficult at all Not difficult at all Not difficult at all Not difficult at all     Data saved with a previous flowsheet row definition        Relevant past medical, surgical, family and social history reviewed and  updated as indicated. Interim medical history since our last visit reviewed. Allergies and medications reviewed and updated.  Review of Systems  Constitutional:  Negative for chills and fever.  Eyes:  Negative for visual disturbance.  Respiratory:  Negative for shortness of breath and wheezing.   Cardiovascular:  Negative for chest pain and leg swelling.  Musculoskeletal:  Negative for back pain and gait problem.  Skin:  Negative for rash.  Neurological:  Negative for dizziness and light-headedness.  All other systems reviewed and are negative.   Per HPI unless specifically indicated above   Allergies as of 02/25/2024       Reactions   Penicillins Anaphylaxis, Rash        Medication List        Accurate as of February 25, 2024  1:21 PM. If you have any questions, ask your nurse or doctor.          STOP taking these medications    cefdinir  300 MG capsule Commonly known as: OMNICEF  Stopped by: Fonda LABOR Arella Blinder       TAKE these medications    DULoxetine  60 MG capsule Commonly known as:  CYMBALTA  Take 1 capsule (60 mg total) by mouth 2 (two) times daily.   fluticasone  50 MCG/ACT nasal spray Commonly known as: FLONASE  USE 2 SPRAYS IN EACH NOSTRIL DAILY   ibuprofen 200 MG tablet Commonly known as: ADVIL Take 200-400 mg by mouth every 6 (six) hours as needed. Pain   pravastatin  40 MG tablet Commonly known as: PRAVACHOL  Take 1 tablet (40 mg total) by mouth daily.   telmisartan  40 MG tablet Commonly known as: MICARDIS  Take one tablet daily   vitamin C with rose hips 500 MG tablet Take 500 mg by mouth daily.   Zinc 50 MG Caps Take by mouth.         Objective:   BP (!) 147/87   Pulse 98   Ht 5' 10 (1.778 m)   Wt 201 lb (91.2 kg)   SpO2 95%   BMI 28.84 kg/m   Wt Readings from Last 3 Encounters:  02/25/24 201 lb (91.2 kg)  09/29/23 188 lb (85.3 kg)  08/25/23 188 lb (85.3 kg)    Physical Exam Physical Exam   VITALS: BP- 147/87 HEENT:  Ears normal. CHEST: Lungs clear to auscultation. CARDIOVASCULAR: Regular heart sounds, no murmurs. ABDOMEN: Abdomen non-tender. SKIN: Sun damage present on skin.  Of both lower legs        Assessment & Plan:   Problem List Items Addressed This Visit       Cardiovascular and Mediastinum   Essential hypertension   Relevant Medications   pravastatin  (PRAVACHOL ) 40 MG tablet   Other Relevant Orders   CBC With Diff/Platelet   CMP14+EGFR   Lipid panel     Other   Anxiety, generalized   Dyslipidemia   Relevant Medications   pravastatin  (PRAVACHOL ) 40 MG tablet   Other Relevant Orders   CBC With Diff/Platelet   CMP14+EGFR   Lipid panel   Other Visit Diagnoses       Physical exam    -  Primary   Relevant Orders   CBC With Diff/Platelet   CMP14+EGFR   Lipid panel          Essential hypertension Blood pressure elevated at 147/87 mmHg during visit. Home readings stable at 120/72 mmHg. - Continue home blood pressure monitoring and report readings.  Dyslipidemia Stable on current cholesterol medication. - Continue current cholesterol medication.  Generalized anxiety disorder Duloxetine  effective for mood regulation. - Continue duloxetine  for anxiety management.  General Health Maintenance Sun damage noted on fair skin. - Monitor sun-damaged skin for changes. - Apply moisturizer to affected areas. - Consider dermatology referral if changes occur.       Follow up plan: Return in about 6 months (around 08/25/2024), or if symptoms worsen or fail to improve, for Recheck anxiety and depression and cholesterol and hypertension.  Counseling provided for all of the vaccine components Orders Placed This Encounter  Procedures   CBC With Diff/Platelet   CMP14+EGFR   Lipid panel    Fonda Levins, MD [facility] 02/25/2024, 1:21 PM

## 2024-03-29 ENCOUNTER — Telehealth: Payer: Self-pay | Admitting: Family Medicine

## 2024-03-29 NOTE — Telephone Encounter (Signed)
 Copied from CRM [id number]. Topic: Clinical - Medical Advice >> Mar 29, 2024 11:02 AM Diannia H wrote: Reason for CRM: Patient is needing to speak to someone about his last visit. Could you assist? Patients callback number is [phone]

## 2024-03-29 NOTE — Telephone Encounter (Signed)
 Patient wanted a review of his visit. I answered his questions to the best of my ability. He asked about labs. I looked and informed that his labs were not collected. I made an appointment for the patient to come in and have labs drawn on Thursday January 15th.

## 2024-04-01 ENCOUNTER — Other Ambulatory Visit

## 2024-04-01 LAB — CBC WITH DIFF/PLATELET
Basophils Absolute: 0.1 x10E3/uL (ref 0.0–0.2)
Basos: 1 %
EOS (ABSOLUTE): 0.1 x10E3/uL (ref 0.0–0.4)
Eos: 2 %
Hematocrit: 46.8 % (ref 37.5–51.0)
Hemoglobin: 16 g/dL (ref 13.0–17.7)
Immature Grans (Abs): 0 x10E3/uL (ref 0.0–0.1)
Immature Granulocytes: 0 %
Lymphocytes Absolute: 2.1 x10E3/uL (ref 0.7–3.1)
Lymphs: 36 %
MCH: 30.8 pg (ref 26.6–33.0)
MCHC: 34.2 g/dL (ref 31.5–35.7)
MCV: 90 fL (ref 79–97)
Monocytes Absolute: 0.3 x10E3/uL (ref 0.1–0.9)
Monocytes: 5 %
Neutrophils Absolute: 3.2 x10E3/uL (ref 1.4–7.0)
Neutrophils: 56 %
Platelets: 183 x10E3/uL (ref 150–450)
RBC: 5.2 x10E6/uL (ref 4.14–5.80)
RDW: 12.7 % (ref 11.6–15.4)
WBC: 5.8 x10E3/uL (ref 3.4–10.8)

## 2024-04-01 LAB — LIPID PANEL
Chol/HDL Ratio: 4 ratio (ref 0.0–5.0)
Cholesterol, Total: 166 mg/dL (ref 100–199)
HDL: 41 mg/dL
LDL Chol Calc (NIH): 103 mg/dL — ABNORMAL HIGH (ref 0–99)
Triglycerides: 124 mg/dL (ref 0–149)
VLDL Cholesterol Cal: 22 mg/dL (ref 5–40)

## 2024-04-01 LAB — CMP14+EGFR
ALT: 37 IU/L (ref 0–44)
AST: 27 IU/L (ref 0–40)
Albumin: 4.5 g/dL (ref 3.9–4.9)
Alkaline Phosphatase: 69 IU/L (ref 47–123)
BUN/Creatinine Ratio: 20 (ref 10–24)
BUN: 19 mg/dL (ref 8–27)
Bilirubin Total: 0.7 mg/dL (ref 0.0–1.2)
CO2: 23 mmol/L (ref 20–29)
Calcium: 9.6 mg/dL (ref 8.6–10.2)
Chloride: 100 mmol/L (ref 96–106)
Creatinine, Ser: 0.97 mg/dL (ref 0.76–1.27)
Globulin, Total: 2.3 g/dL (ref 1.5–4.5)
Glucose: 119 mg/dL — ABNORMAL HIGH (ref 70–99)
Potassium: 4 mmol/L (ref 3.5–5.2)
Sodium: 137 mmol/L (ref 134–144)
Total Protein: 6.8 g/dL (ref 6.0–8.5)
eGFR: 84 mL/min/1.73

## 2024-04-02 ENCOUNTER — Ambulatory Visit: Payer: Self-pay | Admitting: Family Medicine

## 2024-09-29 ENCOUNTER — Ambulatory Visit: Payer: Self-pay
